# Patient Record
Sex: Female | Born: 1975 | Race: White | Hispanic: No | Marital: Married | Smoking: Never smoker
Health system: Southern US, Community
[De-identification: ages and names within clinical notes are randomized; demographics above are authoritative.]

## PROBLEM LIST (undated history)

## (undated) DIAGNOSIS — T7840XA Allergy, unspecified, initial encounter: Secondary | ICD-10-CM

## (undated) DIAGNOSIS — Z8709 Personal history of other diseases of the respiratory system: Secondary | ICD-10-CM

## (undated) DIAGNOSIS — Z8619 Personal history of other infectious and parasitic diseases: Secondary | ICD-10-CM

## (undated) DIAGNOSIS — K219 Gastro-esophageal reflux disease without esophagitis: Secondary | ICD-10-CM

## (undated) DIAGNOSIS — D649 Anemia, unspecified: Secondary | ICD-10-CM

## (undated) DIAGNOSIS — K222 Esophageal obstruction: Secondary | ICD-10-CM

## (undated) DIAGNOSIS — Z87898 Personal history of other specified conditions: Secondary | ICD-10-CM

## (undated) DIAGNOSIS — I10 Essential (primary) hypertension: Secondary | ICD-10-CM

## (undated) DIAGNOSIS — O09529 Supervision of elderly multigravida, unspecified trimester: Secondary | ICD-10-CM

## (undated) HISTORY — DX: Esophageal obstruction: K22.2

## (undated) HISTORY — DX: Anemia, unspecified: D64.9

## (undated) HISTORY — DX: Personal history of other diseases of the respiratory system: Z87.09

## (undated) HISTORY — DX: Supervision of elderly multigravida, unspecified trimester: O09.529

## (undated) HISTORY — DX: Allergy, unspecified, initial encounter: T78.40XA

## (undated) HISTORY — DX: Gastro-esophageal reflux disease without esophagitis: K21.9

## (undated) HISTORY — DX: Essential (primary) hypertension: I10

## (undated) HISTORY — PX: WISDOM TOOTH EXTRACTION: SHX21

## (undated) HISTORY — DX: Personal history of other infectious and parasitic diseases: Z86.19

## (undated) HISTORY — DX: Personal history of other specified conditions: Z87.898

---

## 2006-05-23 ENCOUNTER — Ambulatory Visit: Payer: Self-pay | Admitting: Family Medicine

## 2006-05-23 ENCOUNTER — Encounter: Payer: Self-pay | Admitting: Family Medicine

## 2007-06-14 ENCOUNTER — Encounter (INDEPENDENT_AMBULATORY_CARE_PROVIDER_SITE_OTHER): Payer: Self-pay | Admitting: *Deleted

## 2007-06-14 ENCOUNTER — Ambulatory Visit (HOSPITAL_COMMUNITY): Admission: RE | Admit: 2007-06-14 | Discharge: 2007-06-14 | Payer: Self-pay | Admitting: Internal Medicine

## 2007-06-21 ENCOUNTER — Encounter: Payer: Self-pay | Admitting: Family Medicine

## 2007-06-21 ENCOUNTER — Ambulatory Visit: Payer: Self-pay | Admitting: Obstetrics & Gynecology

## 2008-01-15 ENCOUNTER — Ambulatory Visit: Payer: Self-pay | Admitting: Obstetrics & Gynecology

## 2008-05-22 ENCOUNTER — Ambulatory Visit: Payer: Self-pay | Admitting: Obstetrics & Gynecology

## 2008-05-23 ENCOUNTER — Encounter: Payer: Self-pay | Admitting: Family Medicine

## 2008-05-23 LAB — CONVERTED CEMR LAB
Antibody Screen: NEGATIVE
Basophils Relative: 1 % (ref 0–1)
Eosinophils Absolute: 0.3 10*3/uL (ref 0.0–0.7)
Hemoglobin: 12.6 g/dL (ref 12.0–15.0)
Lymphs Abs: 1.4 10*3/uL (ref 0.7–4.0)
MCHC: 33.3 g/dL (ref 30.0–36.0)
MCV: 90 fL (ref 78.0–100.0)
Monocytes Absolute: 0.5 10*3/uL (ref 0.1–1.0)
Monocytes Relative: 7 % (ref 3–12)
Neutro Abs: 4.6 10*3/uL (ref 1.7–7.7)
RBC: 4.2 M/uL (ref 3.87–5.11)
Rh Type: NEGATIVE
Rubella: 13.4 intl units/mL — ABNORMAL HIGH
WBC: 6.8 10*3/uL (ref 4.0–10.5)

## 2008-06-09 ENCOUNTER — Ambulatory Visit (HOSPITAL_COMMUNITY): Admission: RE | Admit: 2008-06-09 | Discharge: 2008-06-09 | Payer: Self-pay | Admitting: Family Medicine

## 2008-06-10 ENCOUNTER — Encounter: Payer: Self-pay | Admitting: Obstetrics and Gynecology

## 2008-06-10 ENCOUNTER — Ambulatory Visit: Payer: Self-pay | Admitting: Obstetrics and Gynecology

## 2008-07-02 ENCOUNTER — Ambulatory Visit: Payer: Self-pay | Admitting: Obstetrics & Gynecology

## 2008-07-14 ENCOUNTER — Ambulatory Visit (HOSPITAL_COMMUNITY): Admission: RE | Admit: 2008-07-14 | Discharge: 2008-07-14 | Payer: Self-pay | Admitting: Obstetrics & Gynecology

## 2008-08-05 ENCOUNTER — Ambulatory Visit: Payer: Self-pay | Admitting: Family Medicine

## 2008-08-08 ENCOUNTER — Ambulatory Visit (HOSPITAL_COMMUNITY): Admission: RE | Admit: 2008-08-08 | Discharge: 2008-08-08 | Payer: Self-pay | Admitting: Obstetrics & Gynecology

## 2008-09-01 ENCOUNTER — Ambulatory Visit (HOSPITAL_COMMUNITY): Admission: RE | Admit: 2008-09-01 | Discharge: 2008-09-01 | Payer: Self-pay | Admitting: Obstetrics & Gynecology

## 2008-09-02 ENCOUNTER — Ambulatory Visit: Payer: Self-pay | Admitting: Obstetrics & Gynecology

## 2008-09-30 ENCOUNTER — Ambulatory Visit: Payer: Self-pay | Admitting: Obstetrics & Gynecology

## 2008-10-23 ENCOUNTER — Ambulatory Visit: Payer: Self-pay | Admitting: Obstetrics & Gynecology

## 2008-10-23 LAB — CONVERTED CEMR LAB
Antibody Screen: NEGATIVE
Hemoglobin: 11.6 g/dL — ABNORMAL LOW (ref 12.0–15.0)
MCHC: 33.7 g/dL (ref 30.0–36.0)
RDW: 14.3 % (ref 11.5–15.5)

## 2008-10-30 ENCOUNTER — Ambulatory Visit: Payer: Self-pay | Admitting: Obstetrics & Gynecology

## 2008-11-06 ENCOUNTER — Ambulatory Visit: Payer: Self-pay | Admitting: Obstetrics & Gynecology

## 2008-11-20 ENCOUNTER — Ambulatory Visit: Payer: Self-pay | Admitting: Obstetrics and Gynecology

## 2008-12-08 ENCOUNTER — Ambulatory Visit: Payer: Self-pay | Admitting: Obstetrics & Gynecology

## 2008-12-23 ENCOUNTER — Ambulatory Visit: Payer: Self-pay | Admitting: Obstetrics & Gynecology

## 2008-12-30 ENCOUNTER — Encounter: Payer: Self-pay | Admitting: Obstetrics & Gynecology

## 2008-12-30 ENCOUNTER — Ambulatory Visit: Payer: Self-pay | Admitting: Family Medicine

## 2008-12-31 ENCOUNTER — Encounter: Payer: Self-pay | Admitting: Obstetrics & Gynecology

## 2009-01-06 ENCOUNTER — Ambulatory Visit: Payer: Self-pay | Admitting: Obstetrics & Gynecology

## 2009-01-13 ENCOUNTER — Ambulatory Visit: Payer: Self-pay | Admitting: Family Medicine

## 2009-01-20 ENCOUNTER — Ambulatory Visit: Payer: Self-pay | Admitting: Obstetrics & Gynecology

## 2009-01-26 ENCOUNTER — Ambulatory Visit: Payer: Self-pay | Admitting: Obstetrics and Gynecology

## 2009-01-29 ENCOUNTER — Ambulatory Visit (HOSPITAL_COMMUNITY): Admission: RE | Admit: 2009-01-29 | Discharge: 2009-01-29 | Payer: Self-pay | Admitting: Obstetrics & Gynecology

## 2009-01-30 ENCOUNTER — Ambulatory Visit: Payer: Self-pay | Admitting: Obstetrics & Gynecology

## 2009-01-30 ENCOUNTER — Inpatient Hospital Stay (HOSPITAL_COMMUNITY): Admission: RE | Admit: 2009-01-30 | Discharge: 2009-02-02 | Payer: Self-pay | Admitting: Obstetrics & Gynecology

## 2009-03-02 ENCOUNTER — Ambulatory Visit: Payer: Self-pay | Admitting: Obstetrics and Gynecology

## 2009-03-02 LAB — CONVERTED CEMR LAB
HCT: 30.6 % — ABNORMAL LOW (ref 36.0–46.0)
MCHC: 32.4 g/dL (ref 30.0–36.0)
MCV: 89.5 fL (ref 78.0–100.0)
Platelets: 205 10*3/uL (ref 150–400)
RDW: 14.2 % (ref 11.5–15.5)
TSH: 2.844 microintl units/mL (ref 0.350–4.500)
WBC: 5.1 10*3/uL (ref 4.0–10.5)

## 2009-03-16 ENCOUNTER — Ambulatory Visit: Payer: Self-pay | Admitting: Family Medicine

## 2009-04-08 ENCOUNTER — Ambulatory Visit: Payer: Self-pay | Admitting: Obstetrics and Gynecology

## 2009-04-08 LAB — CONVERTED CEMR LAB
HCT: 37.4 % (ref 36.0–46.0)
MCHC: 32.6 g/dL (ref 30.0–36.0)
MCV: 86.4 fL (ref 78.0–100.0)
Platelets: 217 10*3/uL (ref 150–400)
RDW: 14.1 % (ref 11.5–15.5)
WBC: 5.4 10*3/uL (ref 4.0–10.5)

## 2009-04-10 ENCOUNTER — Ambulatory Visit: Payer: Self-pay | Admitting: Family Medicine

## 2009-06-10 ENCOUNTER — Ambulatory Visit: Payer: Self-pay | Admitting: Family Medicine

## 2009-12-10 ENCOUNTER — Encounter (INDEPENDENT_AMBULATORY_CARE_PROVIDER_SITE_OTHER): Payer: Self-pay | Admitting: *Deleted

## 2009-12-21 DIAGNOSIS — K224 Dyskinesia of esophagus: Secondary | ICD-10-CM

## 2009-12-21 DIAGNOSIS — D649 Anemia, unspecified: Secondary | ICD-10-CM | POA: Insufficient documentation

## 2009-12-22 ENCOUNTER — Ambulatory Visit: Payer: Self-pay | Admitting: Internal Medicine

## 2009-12-22 DIAGNOSIS — J019 Acute sinusitis, unspecified: Secondary | ICD-10-CM

## 2010-01-07 ENCOUNTER — Ambulatory Visit: Payer: Self-pay | Admitting: Internal Medicine

## 2010-04-06 ENCOUNTER — Encounter: Payer: Self-pay | Admitting: Internal Medicine

## 2010-06-01 NOTE — Assessment & Plan Note (Signed)
Summary: esophageal spasms...Marland KitchenMarland KitchenMarland Kitchenem   History of Present Illness Visit Type: consult  Primary GI MD: Lina Sar MD Primary Provider: Shelbie Proctor. Shawnie Pons, MD Requesting Provider: Shelbie Proctor. Shawnie Pons, MD Chief Complaint: Dysphagia, belching, and food gets stuck when she swallows  History of Present Illness:   This is a 35 year old white female with intermittent choking with  liquids and solids.She points to her throat when she describes her Sx's.  This has been occurring for the past 3 years. Episodes occur 2-3 times a week and lasts for several seconds or up to 2 hours. The episodes could be associated with drinking cold liquids but also with regular food. She usually has to run to the bathroom and tries to throw up the food. She denies any hoarseness or coughing. The episodes wake her at night. She denies any heartburn except in the third trimester of her pregnancy when she took Prilosec for heartburn. A barium esophagram in December 2009 showed normal distention of the esophagus and normal motility. A 13 mm tablet passed freely without delay. There was no esophagitis or stricture. She describes the level of the obstruction to be in her throat. There is no pre-existing GI history.   GI Review of Systems    Reports belching and  dysphagia with solids.      Denies abdominal pain, acid reflux, bloating, chest pain, dysphagia with liquids, heartburn, loss of appetite, nausea, vomiting, vomiting blood, weight loss, and  weight gain.        Denies anal fissure, black tarry stools, change in bowel habit, constipation, diarrhea, diverticulosis, fecal incontinence, heme positive stool, hemorrhoids, irritable bowel syndrome, jaundice, light color stool, liver problems, rectal bleeding, and  rectal pain.    Current Medications (verified): 1)  Zyrtec Allergy 10 Mg Caps (Cetirizine Hcl) .... One By Mouth Once Daily As Needed 2)  Prenavite Multiple Vitamin 28-0.8 Mg Tabs (Prenatal Vit-Fe Fumarate-Fa) .... One Tablet  By Mouth Once Daily 3)  Tums 500 Mg Chew (Calcium Carbonate Antacid) .... As Needed  Allergies (verified): 1)  ! 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 Wisdom Teeth Removal  Family History: No FH of Colon Cancer: Family History of Heart Disease:   Social History: Occupation: Engineer, site Married One child  Alcohol Use - yes-socially Daily Caffeine Use Illicit Drug Use - no  Review of Systems       The patient complains of allergy/sinus.  The patient denies anemia, anxiety-new, arthritis/joint pain, back pain, blood in urine, breast changes/lumps, change in vision, confusion, cough, coughing up blood, depression-new, fainting, fatigue, fever, headaches-new, hearing problems, heart murmur, heart rhythm changes, itching, menstrual pain, muscle pains/cramps, night sweats, nosebleeds, pregnancy symptoms, shortness of breath, skin rash, sleeping problems, sore throat, swelling of feet/legs, swollen lymph glands, thirst - excessive , urination - excessive , urination changes/pain, urine leakage, vision changes, and voice change.         Pertinent positive and negative review of systems were noted in the above HPI. All other ROS was otherwise negative.   Vital Signs:  Patient profile:   35 year old female Height:      66 inches Weight:      150 pounds BMI:     24.30 BSA:     1.77 Pulse rate:   68 / minute Pulse rhythm:   regular BP sitting:  122 / 80  (left arm) Cuff size:   regular  Vitals Entered By: Ok Anis CMA (December 22, 2009 9:31 AM)  Physical Exam  General:  healthy-appearing, alert and oriented with normal voice. No coughing or hoarseness. Eyes:  PERRLA, no icterus. Mouth:  normal oral mucosa, normal tonsils and tongue, no aphthous ulcers. Neck:  normal  thyroid. No lymphadenopathy. No bruit. Lungs:  Clear throughout to auscultation. Heart:  Regular rate and rhythm; no murmurs, rubs,  or bruits. Abdomen:  Soft, nontender and nondistended. No masses, hepatosplenomegaly or hernias noted. Normal bowel sounds. Extremities:  No clubbing, cyanosis, edema or deformities noted. Skin:  Intact without significant lesions or rashes. Psych:  Alert and cooperative. Normal mood and affect.   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 A recent barium esophagram was normal without suggestion of a stricture or motility disorder. We will proceed with an upper endoscopy to rule out esophageal web or minimal stricture. If negative, we will proceed with an esophageal manometry to assess the function of the upper esophageal sphincter.R/o myasthenia gravis.  Orders: EGD (EGD)  Patient Instructions: 1)  Upper endoscopy with conscious sedation. 2)  If the upper endoscopy and dilatation is nondiagnostic, we will proceed with an esophageal manometry. I have discussed it with the patient. 3)  Copy sent to : Dr Tinnie Gens 4)  The medication list was reviewed and reconciled.  All changed / newly prescribed medications were explained.  A complete medication list was provided to the patient / caregiver.

## 2010-06-01 NOTE — Letter (Signed)
Summary: New Patient letter  [facility]  [address] [place], [place] [address]   Phone: [phone]  Fax: [phone]       12/10/2009 MRN: [id number]  Mikayla Snow [address] [place], [place]  [address]  Dear Ms. Rodger,  Welcome to the Gastroenterology Division at [facility].    You are scheduled to see Dr.  Lina Sar on December 22, 2009 at 9:15am on the 3rd floor at [facility], [address]  We ask that you try to arrive at our office 15 minutes prior to your appointment time to allow for check-in.  We would like you to complete the enclosed self-administered evaluation form prior to your visit and bring it with you on the day of your appointment.  We will review it with you.  Also, please bring a complete list of all your medications or, if you prefer, bring the medication bottles and we will list them.  Please bring your insurance card so that we may make a copy of it.  If your insurance requires a referral to see a specialist, please bring your referral form from your primary care physician.  Co-payments are due at the time of your visit and may be paid by cash, check or credit card.     Your office visit will consist of a consult with your physician (includes a physical exam), any laboratory testing he/she may order, scheduling of any necessary diagnostic testing (e.g. x-ray, ultrasound, CT-scan), and scheduling of a procedure (e.g. Endoscopy, Colonoscopy) if required.  Please allow enough time on your schedule to allow for any/all of these possibilities.    If you cannot keep your appointment, please call [phone] to cancel or reschedule prior to your appointment date.  This allows [place] the opportunity to schedule an appointment for another patient in need of care.  If you do not cancel or reschedule by 5 p.m. the business day prior to your appointment date, you will be charged a $50.00 late cancellation/no-show fee.    Thank you for  choosing [facility] for your medical needs.  We appreciate the opportunity to care for you.  Please visit [place] at our website  to learn more about our practice.                     Sincerely,                                                             The Gastroenterology Division

## 2010-06-01 NOTE — Medication Information (Signed)
Summary: Approved Omeprazole / [facility]  Approved Omeprazole / [facility]   Imported By: Lennie Odor 04/09/2010 15:34:13  _____________________________________________________________________  External Attachment:    Type:   Image     Comment:   External Document

## 2010-06-01 NOTE — Miscellaneous (Signed)
Summary: prilosec order  Clinical Lists Changes  Medications: Added new medication of PRILOSEC 20 MG  CPDR (OMEPRAZOLE) 1 each day 30 minutes before meal - Signed Rx of PRILOSEC 20 MG  CPDR (OMEPRAZOLE) 1 each day 30 minutes before meal;  #30 x 6;  Signed;  Entered by: Alease Frame RN;  Authorized by: Hart Carwin MD;  Method used: Electronically to [facility]  [place] [place], [place]  [address], Ph: [phone] or [phone], Fax: [phone] Observations: Added new observation of ALLERGY REV: Done (01/07/2010 17:25)    Prescriptions: PRILOSEC 20 MG  CPDR (OMEPRAZOLE) 1 each day 30 minutes before meal  #30 x 6   Entered by:   Alease Frame RN   Authorized by:   Hart Carwin MD   Signed by:   Alease Frame RN on 01/07/2010   Method used:   [facility]ronically to        [facility]  [place] (retail)       [address]       [place], [place]  [address]       Ph: [phone] or [phone]       Fax: [phone]   RxID:   [id number]

## 2010-06-01 NOTE — Letter (Signed)
Summary: EGD Instructions  [facility]  [address] [place], [place] [address]   Phone: [phone]  Fax: [phone]       Mikayla Snow    Apr 01, 1976    MRN: [id number]       Procedure Day /Date: Thursday 01/07/10     Arrival Time: 3:00 pm     Procedure Time: 4:00 pm     Location of Procedure:                    _x _  Endoscopy Center (4th Floor)  PREPARATION FOR ENDOSCOPY   On 01/07/10 THE DAY OF THE PROCEDURE:  1.   No solid foods, milk or milk products are allowed after midnight the night before your procedure.  2.   Do not drink anything colored red or purple.  Avoid juices with pulp.  No orange juice.  3.  You may drink clear liquids until 2:00 pm, which is 2 hours before your procedure.                                                                                                CLEAR LIQUIDS INCLUDE: Water Jello Ice Popsicles Tea (sugar ok, no milk/cream) Powdered fruit flavored drinks Coffee (sugar ok, no milk/cream) Gatorade Juice: apple, white grape, white cranberry  Lemonade Clear bullion, consomm, broth Carbonated beverages (any kind) Strained chicken noodle soup Hard Candy   MEDICATION INSTRUCTIONS  Unless otherwise instructed, you should take regular prescription medications with a small sip of water as early as possible the morning of your procedure.                  OTHER INSTRUCTIONS  You will need a responsible adult at least 37 years of age to accompany you and drive you home.   This person must remain in the waiting room during your procedure.  Wear loose fitting clothing that is easily removed.  Leave jewelry and other valuables at home.  However, you may wish to bring a book to read or an iPod/MP3 player to listen to music as you wait for your procedure to start.  Remove all body piercing jewelry and leave at home.  Total time from sign-in until discharge is approximately 2-3 hours.  You should go home  directly after your procedure and rest.  You can resume normal activities the day after your procedure.  The day of your procedure you should not:   Drive   Make legal decisions   Operate machinery   Drink alcohol   Return to work  You will receive specific instructions about eating, activities and medications before you leave.    The above instructions have been reviewed and explained to me by  Lamona Curl CMA Duncan Dull)  December 22, 2009 10:51 AM     I fully understand and can verbalize these instructions _____________________________ Date 12/22/09

## 2010-06-01 NOTE — Procedures (Signed)
Summary: Upper Endoscopy  Patient: Mikayla Snow Note: All result statuses are Final unless otherwise noted.  Tests: (1) Upper Endoscopy (EGD)   EGD Upper Endoscopy       DONE     [facility]     [address]     [place], [place]  [address]           ENDOSCOPY PROCEDURE REPORT           PATIENT:  [name], [name]  MR#:  [id number]     BIRTHDATE:  31-Jul-1975, 34 yrs. old  GENDER:  female           ENDOSCOPIST:  Hedwig Morton. Juanda Chance, MD     Referred by:  Jonette Eva, M.D.           PROCEDURE DATE:  01/07/2010     PROCEDURE:  EGD with dilatation over guidewire     ASA CLASS:  Class I     INDICATIONS:  dysphagia barium swallow 2009 was normal           MEDICATIONS:   Versed 8 mg, Fentanyl 75 mcg     TOPICAL ANESTHETIC:  Exactacain Spray           DESCRIPTION OF PROCEDURE:   After the risks benefits and     alternatives of the procedure were thoroughly explained, informed     consent was obtained.  The LB GIF-H180 [id number] endoscope was     introduced through the mouth and advanced to the second portion of     the duodenum, without limitations.  The instrument was slowly     withdrawn as the mucosa was fully examined.     <<PROCEDUREIMAGES>>           A stricture was found in the distal esophagus (see image1, image6,     and image7). benign appearing distal esophageal stricture at 36     cm, admitted the scope with mild resistance, diameter abou 14 mm     Savary dilation over a guidewire 14,15,16,17 mm dilator passed     without difficulty  A hiatal hernia was found (see image5 and     image4). 3 cm hiatal hernia not reducible  Otherwise the     examination was normal (see image2, image3, and image4).     Retroflexed views revealed no abnormalities.    The scope was then     withdrawn from the patient and the procedure completed.           COMPLICATIONS:  None           ENDOSCOPIC IMPRESSION:     1) Stricture in the distal esophagus     2) Hiatal hernia     3) Otherwise normal  examination     s/p dilation to 17 mm ( 59F)     RECOMMENDATIONS:     1) Anti-reflux regimen to be follow     Prelosec 20 mg po qd, #30, 6 refills           REPEAT EXAM:  In 0 year(s) for.  redilate prn           ______________________________     Hedwig Morton. Juanda Chance, MD           CC:           n.     eSIGNED:   Hedwig Morton. Brodie at 01/07/2010 05:09 PM           [place], [phone]  Note: An exclamation mark (!) indicates a result that was not dispersed into the flowsheet. Document Creation Date: 01/07/2010 5:10 PM _______________________________________________________________________  (1) Order result status: Final Collection or observation date-time: 01/07/2010 16:56 Requested date-time:  Receipt date-time:  Reported date-time:  Referring Physician:   Ordering Physician: Lina Sar [phone]) Specimen Source:  Source: Launa Grill Order Number: [id number] Lab site:

## 2010-06-15 ENCOUNTER — Ambulatory Visit: Payer: Self-pay | Admitting: Obstetrics and Gynecology

## 2010-06-29 ENCOUNTER — Ambulatory Visit: Payer: BC Managed Care – PPO | Admitting: Obstetrics and Gynecology

## 2010-06-29 DIAGNOSIS — Z1272 Encounter for screening for malignant neoplasm of vagina: Secondary | ICD-10-CM

## 2010-06-29 DIAGNOSIS — Z01419 Encounter for gynecological examination (general) (routine) without abnormal findings: Secondary | ICD-10-CM

## 2010-07-23 NOTE — Assessment & Plan Note (Signed)
NAME:  Mikayla, Snow                 ACCOUNT NO.:  [id number]  MEDICAL RECORD NO.:  [id number]          PATIENT TYPE:  LOCATION:  [facility] at [facility]           FACILITY:  PHYSICIAN:  Allie Bossier, MD             DATE OF BIRTH:  DATE OF SERVICE:  06/29/2010                                 CLINIC NOTE  IDENTIFICATION:  [name] is a 35 year old married white G1, P1.  She has a son who was born in October at 10 pounds and 6 ounces via forceps vaginal delivery with a third-degree care.  She comes in here today for annual exam.  She denies any complaints.  She and her husband have been actively trying to have another pregnancy since July 2011, when she resumed her having periods (she breast fed for 9 months).  She is taking folic acid and a vitamin.  She is using ovulation prediction kits and is ovulating.  PAST MEDICAL HISTORY: 1. Esophageal stricture in the past. 2. Obviously, she has had a large-for-gestational-age infant with a     normal 1-hour Glucola.  PAST SURGICAL HISTORY:  Wisdom teeth extraction.  DRUG ALLERGIES:  IBUPROFEN causes hives.  No latex allergies.  MEDICATIONS:  She takes Prilosec daily and prenatal vitamins daily, one with Zyrtec 10 mg daily.  PHYSICAL EXAMINATION:  GENERAL:  Well-nourished, well-hydrated, very pleasant white female. VITAL SIGNS:  Weight 154 pounds, blood pressure 127/72, pulse 81, her height is 5 feet 5.5 inches. HEENT:  Normal. BREASTS:  Normal. HEART:  Regular rate and rhythm. LUNGS:  Clear to auscultation bilaterally. ABDOMEN:  Benign. EXTERNAL GENITALIA:  No lesions.  She does have a moderate amount of cystocele and rectocele and uterine prolapse.  On speculum exam, her cervix is patulous, but no lesions are noted.  Bimanual exam reveals a normal size and shape retroverted uterus, nontender and mobile.  Her adnexa are nontender without masses.  ASSESSMENT AND PLAN: 1. Annual examination.  I have checked Pap smear, recommended  self-     breast and self-vulvar examinations. 2. Desire for conception.  I have explained that the average couple     takes a year to conceive and that should she not conceive by July     2012, she should come in and I will offer her Clomid therapy.    Allie Bossier, MD   MCD/MEDQ  D:  06/29/2010  T:  06/30/2010  Job:  [id number]

## 2010-08-05 LAB — CBC
HCT: 36.5 % (ref 36.0–46.0)
Hemoglobin: 7.7 g/dL — CL (ref 12.0–15.0)
Hemoglobin: 8.7 g/dL — ABNORMAL LOW (ref 12.0–15.0)
MCHC: 34.6 g/dL (ref 30.0–36.0)
MCHC: 34.6 g/dL (ref 30.0–36.0)
Platelets: 133 10*3/uL — ABNORMAL LOW (ref 150–400)
Platelets: 163 10*3/uL (ref 150–400)
RBC: 2.69 MIL/uL — ABNORMAL LOW (ref 3.87–5.11)
RDW: 13.8 % (ref 11.5–15.5)
RDW: 14.1 % (ref 11.5–15.5)
WBC: 10.3 10*3/uL (ref 4.0–10.5)
WBC: 25.9 10*3/uL — ABNORMAL HIGH (ref 4.0–10.5)

## 2010-08-05 LAB — RH IMMUNE GLOB WKUP(>/=20WKS)(NOT WOMEN'S HOSP): Fetal Screen: NEGATIVE

## 2010-08-05 LAB — RPR: RPR Ser Ql: NONREACTIVE

## 2010-09-11 ENCOUNTER — Other Ambulatory Visit: Payer: Self-pay | Admitting: Internal Medicine

## 2010-09-14 NOTE — Assessment & Plan Note (Signed)
NAMEHAVANNAH, STREAT                  ACCOUNT NO.:  [id number]   MEDICAL RECORD NO.:  [id number]          PATIENT TYPE:  POB   LOCATION:  [facility] at [facility]         FACILITY:  [facility]   PHYSICIAN:  Elsie Lincoln, MD      DATE OF BIRTH:  28-Dec-1975   DATE OF SERVICE:  01/15/2008                                  CLINIC NOTE   The patient is a 35 year old nulliparous female who has gone off birth  control pills.  Her last menstrual period was December 25, 2007.  She is  trying to conceive.  She has been taking prenatal vitamins before and  stopping the birth control pills.  We briefly reviewed her past medical  history and there is no past medical problems.  Her family history is  not complicated by any genetic abnormalities or conditions that should  be investigated prior to conception.  She has agreed to a cystic  fibrosis screen today.  The father of the baby/husband's family history  is also clear.  Blood pressure today was 118/80, weight 141, height 5  feet 5-1/2 inches.  She is of normal BMI.  The patient was warned of the  increased risk of twins during the first month of last cycle.  I had to  stop her birth control pills and she is fine with this.  She knows to  call us after she misses her first period and has a positive pregnancy  test.  If she goes more than 6 months without conceiving, she should  make a followup appointment for infertility.           ______________________________  Elsie Lincoln, MD     KL/MEDQ  D:  01/15/2008  T:  01/16/2008  Job:  [id number]

## 2010-09-14 NOTE — Assessment & Plan Note (Signed)
NAMEGLENA, Mikayla Snow                  ACCOUNT NO.:  [id number]   MEDICAL RECORD NO.:  [id number]          PATIENT TYPE:  POB   LOCATION:  [facility] at [facility]         FACILITY:  [facility]   PHYSICIAN:  Argentina Donovan, MD        DATE OF BIRTH:  1975-10-23   DATE OF SERVICE:  03/16/2009                                  CLINIC NOTE   The patient is a 35 year old gravida 1, para 1-0-0-1 who is in for her 6-  week visit.  She came in 1 week before because of symptoms that was  thought by the screener that might be postpartum depression.  I do not  think so and she was doing very well at that time.  She is nursing 8-9  times a day and on 3 days before this visit, she began having heavier  bleeding, called the emergency number and they think probably a period  starting and that is very very possible as it starting to slack-off  today.  She had been spotting continually since then, she has also not  had sexual intercourse or any sexual contacts since the birth of the  baby.  She has had some lower abdominal and pelvic pressure when she  stands on her feet.   PHYSICAL EXAMINATION:  ABDOMEN:  Soft, nontender.  No masses or  organomegaly.  GU:  External genitalia is normal.  BUS within normal limits.  The  vagina is clean with a moderate amount of blood.  The patient is having  a period.  Cervix is clean and well healed.  The uterus is well  involuted.  The rectum is full with stool, however; and the adnexa is  normal.   IMPRESSION:  Normal postpartum exam.  I am going to start her on Low-  Ogestrel, which she was taken with her prior to getting pregnant.  She  said she did very well on those.  I have told I did not see any reason  why she could not restart sexual activity or see any sign of any  permanent or injury that she should probably do find in her postpartum  state.  The CBC and thyroid that were done prior were normal with  exception of a marked anemia, which she apparently run with a 9.9  hemoglobin and 30.6 hematocrit.  She is on iron supplement.  I am going  to have him repeat CBC in 1 month 6 weeks and then she can stop her  heavy intake of iron.  She is still taking prenatal vitamins, encouraged  her to continue on.  We are starting on birth control pills as of now.           ______________________________  Argentina Donovan, MD     PR/MEDQ  D:  03/16/2009  T:  03/17/2009  Job:  [id number]

## 2010-09-14 NOTE — Assessment & Plan Note (Signed)
[name], [name]                  ACCOUNT NO.:  [id number]   MEDICAL RECORD NO.:  [id number]          PATIENT TYPE:  POB   LOCATION:  [facility]         FACILITY:  [facility]   PHYSICIAN:  Argentina Donovan, MD        DATE OF BIRTH:  04/16/1976   DATE OF SERVICE:  03/02/2009                                  CLINIC NOTE   The patient is a 35 year old gravida 1, para 1-0-0-1, who delivered a 10  pounds 6 ounces female infant by outlet forceps delivery with a third-  degree extension of the midline episiotomy on January 31, 2009.  She has  done very well postoperatively from a physical point of view as far as  healing of the episiotomy and pelvic pain, etc.  She has been seeing a  lactation specialist because she was having a little problem with nipple  soreness and latching on and that seems to be under control.  But what  she came in for today was mainly a complaint of extreme difficulty with  temperature management.  She has terrible sweats and then that will pass  and then she cannot tolerate the cold, so she follows like sweats  following by chills.  She said she had a spot on her tonsil, but she has  not really had any sore throat.  She has not had any respiratory  symptoms at all.  She denies snoring.  She has no significant pain.  She  has some usual postpartum blues symptoms like occasionally crying for  apparently no reason, but she does not seem to have symptoms of any type  of true postpartum depression per se that could develop certainly and  her mother is on medication for depression and told her that she  probably ought to come in.  With exception of this temperature control,  she seems to be pretty much asymptomatic and we are going to get a CBC  and a thyroid screen, and I am going to have her come back in a week.  I  have told her if these symptoms seem to go away, nor coming back, she  does not have any other symptoms that would give suspicion to underlying  infection and  she is doing in 2 weeks for 6-week checkup so as a safety  precaution, we will have her come back in a week if symptoms are  persisting.  I told her if she does come back, to bring her husband with  her, so sometimes he notices things that she has not told us about.  In  any case, she certainly does not think she is depressed, does not seem  to have any symptoms for depression, and I am just concerned about some  type of underlying infection may be causing the day sweats other than  that from the abrupt hormonal changes that occur postpartum.   IMPRESSION:  A well-healing postpartum with a chief complaint of  difficulty tolerating heat or cold.           ______________________________  Argentina Donovan, MD     PR/MEDQ  D:  03/02/2009  T:  03/03/2009  Job:  [id number] 

## 2010-09-14 NOTE — Assessment & Plan Note (Signed)
Mikayla Snow, Mikayla Snow                  ACCOUNT NO.:  [id number]   MEDICAL RECORD NO.:  [id number]          PATIENT TYPE:  POB   LOCATION:  [facility] at [facility]         FACILITY:  [facility]   PHYSICIAN:  Tinnie Gens, MD        DATE OF BIRTH:  Jun 22, 1975   DATE OF SERVICE:  06/10/2009                                  CLINIC NOTE   CHIEF COMPLAINT:  Yearly exam.   HISTORY OF PRESENT ILLNESS:  The patient is a 35 year old gravida 1,  para 1 who had a baby in October, 10 pounds 6 ounces with forceps and a  third-degree tear.  She is breastfeeding and doing well.  She pumps at  work.  She is on fenugreek to help with the supply.  She is interested  in trying to try to conceive again in the next few months.  She  continues on prenatal vitamins.  She reports her mood is good.  She has  not had a cycle.   PAST MEDICAL HISTORY:  Negative.   PAST SURGICAL HISTORY:  Wisdom teeth removal.   MEDICATIONS:  Fenugreek  and Zyrtec daily.   ALLERGIES:  IBUPROFEN.   FAMILY HISTORY:  Hypertension in her parents.   SOCIAL HISTORY:  The patient works as a Nurse, mental health.  She is a  social alcohol user and occasional caffeine user.  She does not smoke.   OBSTETRICAL HISTORY:  G1, P1 with 1 forceps-assisted vaginal delivery of  a 10-pound 6-ounce baby after pushing for 4 hours with a third-degree  episiotomy and extension.   GYNECOLOGIC HISTORY:  Menarche at age 89.  Cycles are regular except for  right now while she is nursing.  She has history of OCPs.  No history of  STDs.  No history of abnormal Pap smear.  Last Pap was in 2010 and was  normal.   PHYSICAL EXAMINATION:  VITAL SIGNS:  Today, her vitals are as noted in  the chart.  GENERAL:  She is a well-developed, well-nourished female, in no acute  distress.  BREASTS:  Symmetric, full of milk.  No significant masses noted.  NECK:  Her thyroid is normal in contour, no mass.  LUNGS:  Clear bilaterally.  CARDIOVASCULAR:  Regular rate and rhythm.  No  rubs, gallops, or murmurs.  ABDOMEN:  Soft, nontender, nondistended.  EXTREMITIES:  No cyanosis, clubbing, or edema.  GU:  Normal external female genitalia.  BUS is normal.  Vagina is pink  and rugated.  There is some descensus noted anteriorly and posteriorly  and the vaginal walls come in as well.  Cervix is without lesions.  Uterus is small, anteverted.  No adnexal mass or tenderness.   IMPRESSION:  Yearly exam, breastfeeding female.   PLAN:  Encouraged her to try achieve pregnancy again soon given her age.  Continue prenatal vitamins.           ______________________________  Tinnie Gens, MD     TP/MEDQ  D:  06/10/2009  T:  06/11/2009  Job:  [id number]

## 2010-09-14 NOTE — Assessment & Plan Note (Signed)
NAMEBRIT, CARBONELL                  ACCOUNT NO.:  [id number]   MEDICAL RECORD NO.:  [id number]          PATIENT TYPE:  POB   LOCATION:  [facility] at [facility]         FACILITY:  [facility]   PHYSICIAN:  Elsie Lincoln, MD      DATE OF BIRTH:  12-30-1975   DATE OF SERVICE:  06/21/2007                                  CLINIC NOTE   The patient is a 35 year old G0 female who presents for yearly exam.  She has normal periods and needs some Lo/Ovral generic for her birth  control.  She is not on it this past month because her prescription ran  out and she is using condoms for birth control right now. She will  restart the Lo/Ovral generic with her next period. Prescription was  given today.  The patient is considering getting pregnant.  We talked  about folic acid and cystic fibrosis screening.  Patient has no  complaints.   PAST MEDICAL HISTORY:  No changes.   PAST SURGICAL HISTORY:  No changes.   PAST GYN HISTORY:  No changes in last year other than the above stopping  of the pills.   SOCIAL HISTORY:  She is married for 10 years and is still employed as a  Runner, broadcasting/film/video.  She started running recently and ran her first half marathon  in just over 2 hours.  She also is performing in a show in [place].  The patient has a good balance between life and work.   REVIEW OF SYSTEMS:  Is negative for all points.   FAMILY HISTORY:  No changes. Her parents still have blood pressure and  there is no familial cancers developed.   VITAL SIGNS:  Pulse 80, blood pressure 143/91, weight 141.5, height 5  feet 5.5 inches.  GENERAL:  Well-nourished, well-developed in apparent distress.  HEENT:  Normocephalic, atraumatic.  Good dentition.  The patient was  reminded to floss. Thyroid no masses.  LUNGS:  Clear to auscultation  bilaterally.  HEART:  Regular rate and rhythm.  ABDOMEN:  Soft, nontender and no organomegaly, no hernia.  GENITALIA:  Tanner five.  Vagina pink, normal rugae.  Bladder and rectum  well  supported.  Urethra nontender.  Cervix closed, nontender. Uterus  anteverted, nontender. Adnexa no masses, nontender.  RECTAL:  No hemorrhoids.  EXTREMITIES:  Nontender.   ASSESSMENT/PLAN:  A 35 year old female with well woman exam.  1. Pap smear.  2. The patient would come get CF screening before she conceives  3. Prenatal vitamins 1 month for prior to her trying to conceive.           ______________________________  Elsie Lincoln, MD     KL/MEDQ  D:  06/21/2007  T:  06/21/2007  Job:  [id number]

## 2010-09-17 NOTE — Assessment & Plan Note (Signed)
NAMESUESAN, MOHRMANN                  ACCOUNT NO.:  [id number]   MEDICAL RECORD NO.:  [id number]          PATIENT TYPE:  POB   LOCATION:  [facility] at [facility]         FACILITY:  [facility]   PHYSICIAN:  Matt Holmes, N.P.       DATE OF BIRTH:  1975/12/15   DATE OF SERVICE:  05/23/2006                                  CLINIC NOTE   NEW PATIENT OFFICE NOTE   Ms. Molla is a 35 year old white female gravida 0 who presents for an  annual exam.  She has moved to [place] from [place], [place] and is a Runner, broadcasting/film/video at [employer].  She  voices no complaints and states that she is here for her annual Pap  smear and refill of her birth control pill.  She says that she and her  husband have no plans to start their family within the year.  She states  that her periods are regular, occurring every 28 days, and normal flow  of 4-5 days.  She denies breast, bladder and vaginal symptoms.  She  states she has used patches and rings in the past for birth control but  is happier with birth control pills.   PAST MEDICAL HISTORY:  Noncontributory.   MEDICATIONS:  She is on Low-Ogestrel for birth control and Zyrtec for  seasonal allergies.   SOCIAL HISTORY:  The patient has been married for 9 years.  She is  employed as a Runner, broadcasting/film/video.  She admits to rare alcohol use and drinks 1-2  caffeinated beverages a day.  She denies tobacco use.   REVIEW OF SYSTEMS:  Patient reports vaginal itching a while back that  was treated with 1 day Monistat with resolution of symptoms.  She does  have seasonal allergies and allergies to cat hair.   FAMILY HISTORY:  Both her parents have high blood pressure.   PHYSICAL EXAMINATION:  Patient is a well-nourished, well-developed,  white female.  Her weight is 40.  Her blood pressure is 123/73 and her  pulse is 65.  She states her first day of her last menstrual period was  April 29, 2006.  Head, ears, eyes, nose and throat within normal  limits.   Heart rate and rhythm were regular without murmur, gallop or  cardiac enlargement.  Lungs bilaterally clear to A&P.  Breast exam:  Breasts are bilaterally soft without tenderness, nodes or nipple  discharge bilaterally.  Abdomen soft and scaphoid without  hepatosplenomegaly.  Pelvic exam:  External genitalia within normal  limits for female.  Vagina and clean and rugose with some white creamy  discharge without odor.  Cervix is nontender and nonfriable.  Uterus is  normal shape, size and contour.  Adnexa bilaterally clear and without  tenderness.   IMPRESSION:  1. Normal gyn exam.  2. Contraceptive need.  3. Normal wet prep.   PLAN:  Low-Ogestrel 28s one p.o. q.d. generic substitution is allowed  with refills for 1 year.  She is encouraged to do self breast exams  monthly.  She is to return to the office in 1 year for her annual exam  or p.r.n.  ______________________________  Matt Holmes, N.P.     EMK/MEDQ  D:  05/23/2006  T:  05/23/2006  Job:  [id number]

## 2010-10-14 ENCOUNTER — Other Ambulatory Visit: Payer: Self-pay | Admitting: Obstetrics & Gynecology

## 2010-10-14 ENCOUNTER — Encounter (INDEPENDENT_AMBULATORY_CARE_PROVIDER_SITE_OTHER): Payer: BC Managed Care – PPO

## 2010-10-14 DIAGNOSIS — O3680X0 Pregnancy with inconclusive fetal viability, not applicable or unspecified: Secondary | ICD-10-CM

## 2010-10-14 DIAGNOSIS — Z3480 Encounter for supervision of other normal pregnancy, unspecified trimester: Secondary | ICD-10-CM

## 2010-10-19 ENCOUNTER — Other Ambulatory Visit (HOSPITAL_COMMUNITY): Payer: BC Managed Care – PPO

## 2010-10-19 ENCOUNTER — Ambulatory Visit (HOSPITAL_COMMUNITY)
Admission: RE | Admit: 2010-10-19 | Discharge: 2010-10-19 | Disposition: A | Discharge status: Home / Self Care | Payer: BC Managed Care – PPO | Source: Ambulatory Visit | Attending: Obstetrics & Gynecology | Admitting: Obstetrics & Gynecology

## 2010-10-19 DIAGNOSIS — O09529 Supervision of elderly multigravida, unspecified trimester: Secondary | ICD-10-CM | POA: Insufficient documentation

## 2010-10-19 DIAGNOSIS — O3680X0 Pregnancy with inconclusive fetal viability, not applicable or unspecified: Secondary | ICD-10-CM

## 2010-10-19 DIAGNOSIS — Z3689 Encounter for other specified antenatal screening: Secondary | ICD-10-CM | POA: Insufficient documentation

## 2010-10-27 ENCOUNTER — Encounter (INDEPENDENT_AMBULATORY_CARE_PROVIDER_SITE_OTHER): Payer: BC Managed Care – PPO | Admitting: Obstetrics & Gynecology

## 2010-10-27 ENCOUNTER — Other Ambulatory Visit: Payer: Self-pay | Admitting: Obstetrics & Gynecology

## 2010-10-27 DIAGNOSIS — O09529 Supervision of elderly multigravida, unspecified trimester: Secondary | ICD-10-CM

## 2010-10-27 DIAGNOSIS — Z3480 Encounter for supervision of other normal pregnancy, unspecified trimester: Secondary | ICD-10-CM

## 2010-10-27 DIAGNOSIS — Z3682 Encounter for antenatal screening for nuchal translucency: Secondary | ICD-10-CM

## 2010-11-18 ENCOUNTER — Encounter: Payer: BC Managed Care – PPO | Admitting: Obstetrics & Gynecology

## 2010-11-23 IMAGING — US US OB COMP LESS 14 WK
1 series · 14 of 20 positions shown · non-contrast
Comparison: none

CLINICAL DATA: Viability and dates.

OBSTETRIC <14 WK US AND TRANSVAGINAL OB US
TECHNIQUE: Both transabdominal and transvaginal ultrasound
examinations were performed for complete evaluation of the
gestation as well as the maternal uterus, adnexal regions, and
pelvic cul-de-sac.

[Series 1: us ob transvaginal modify · 20 acquisitions, 14 frames shown]
[im 1/20]
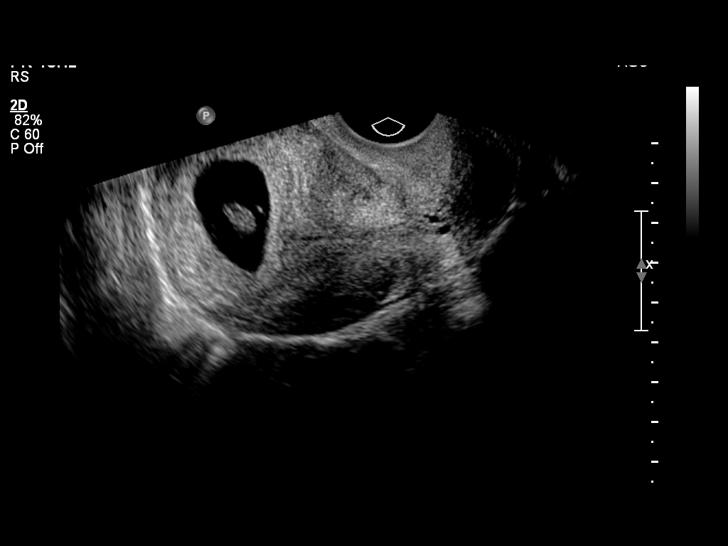
[im 3/20]
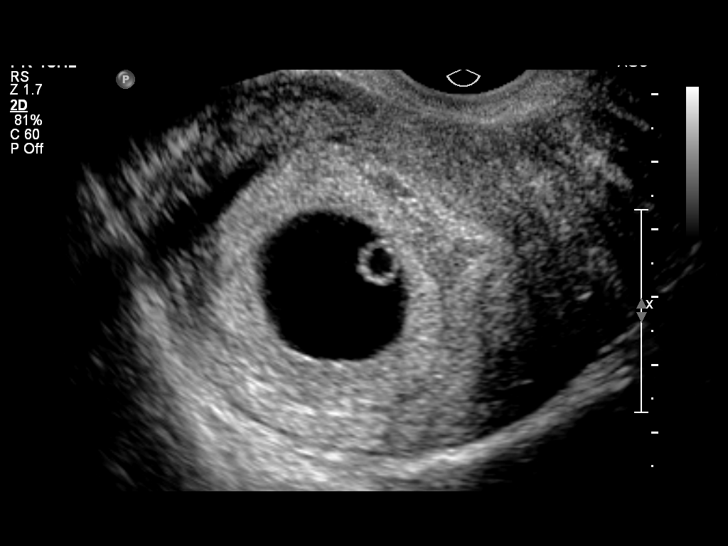
[im 4/20]
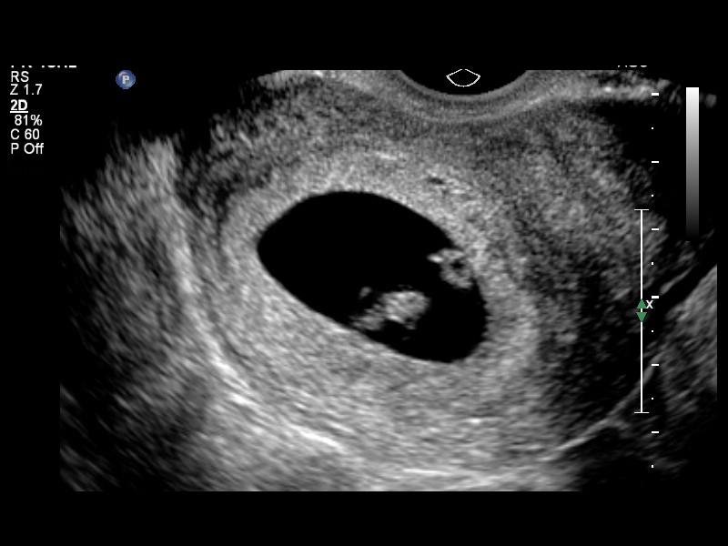
[im 6/20]
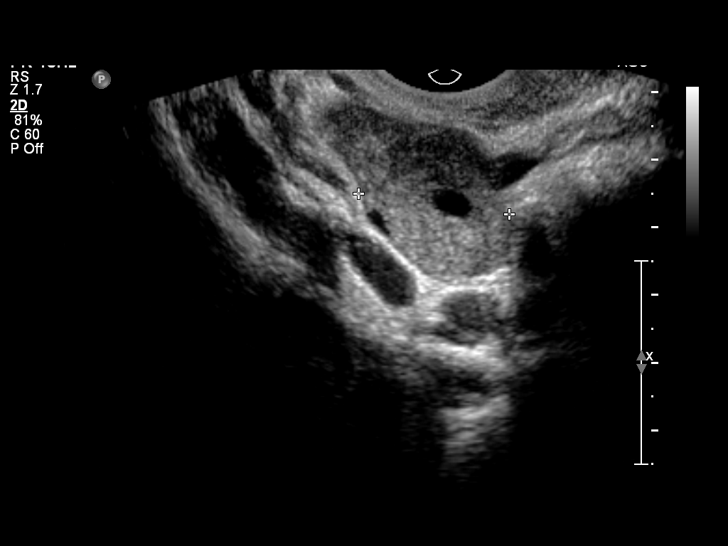
[im 7/20]
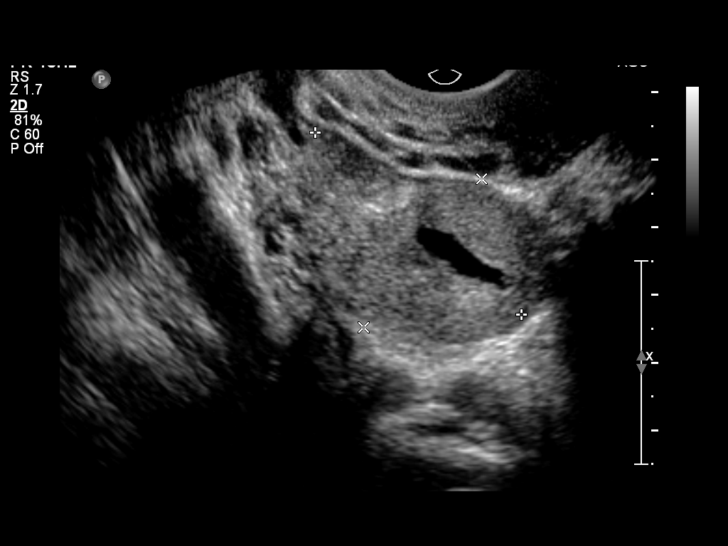
[im 8/20]
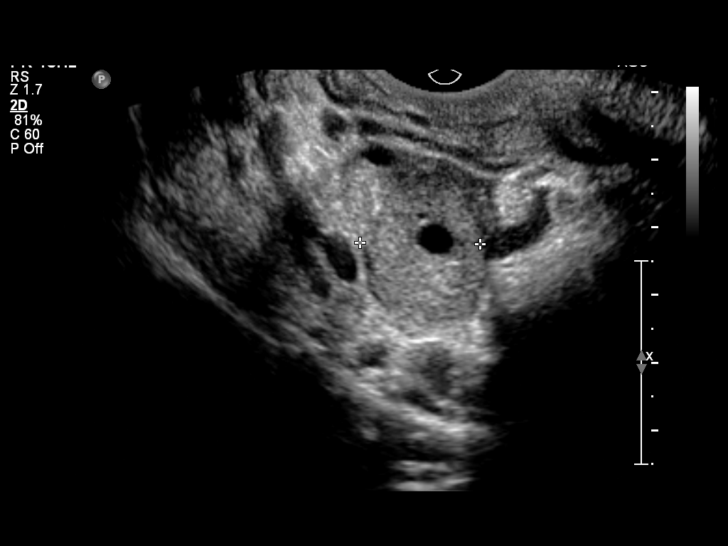
[im 10/20]
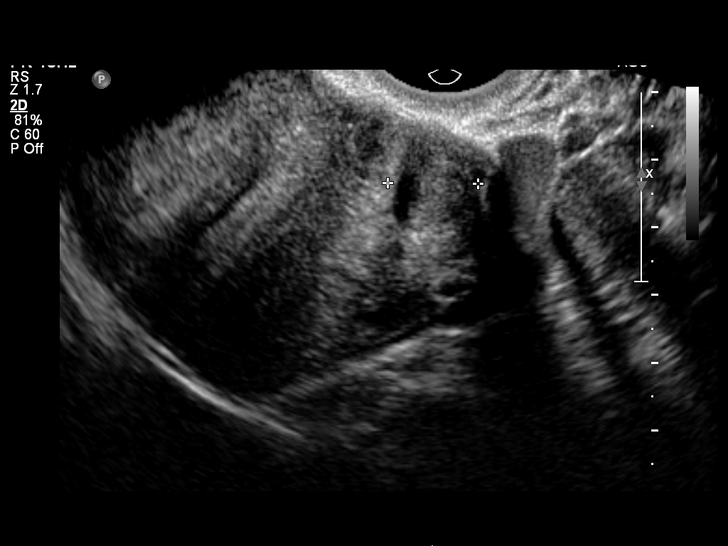
[im 11/20]
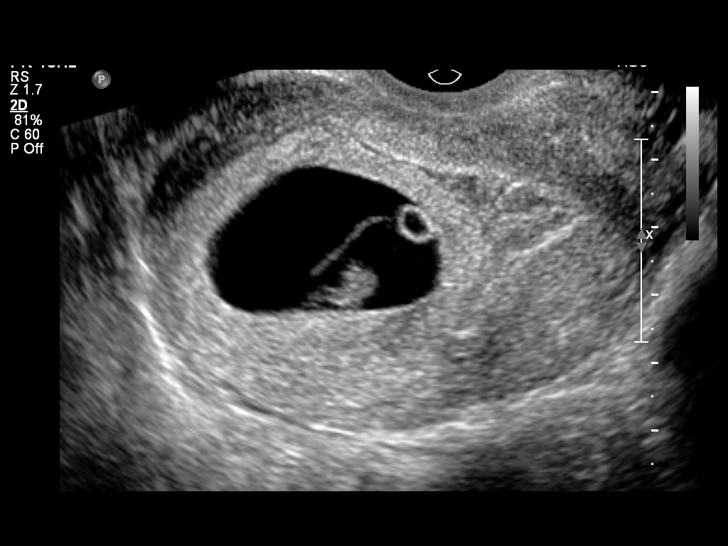
[im 13/20]
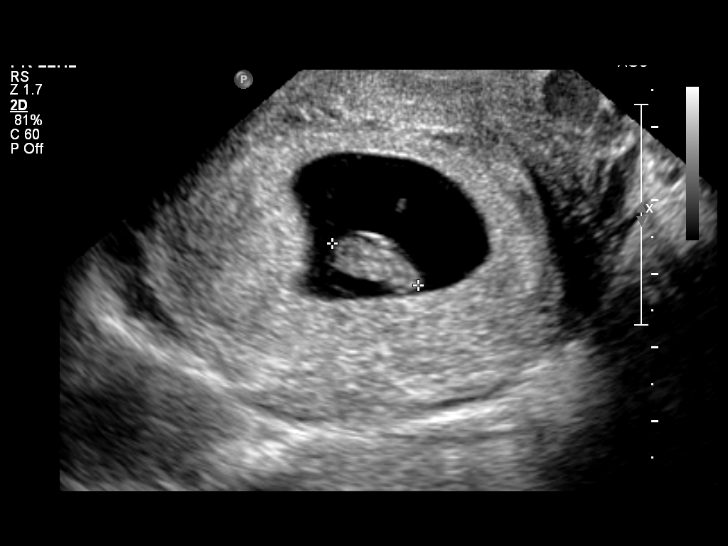
[im 14/20]
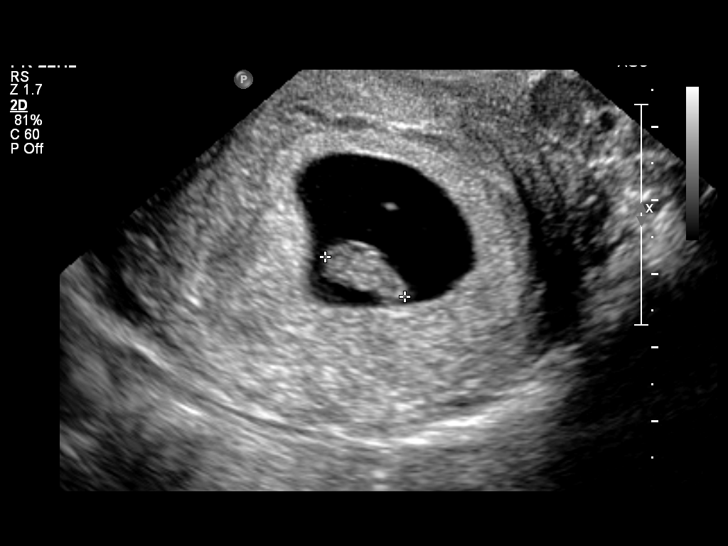
[im 16/20]
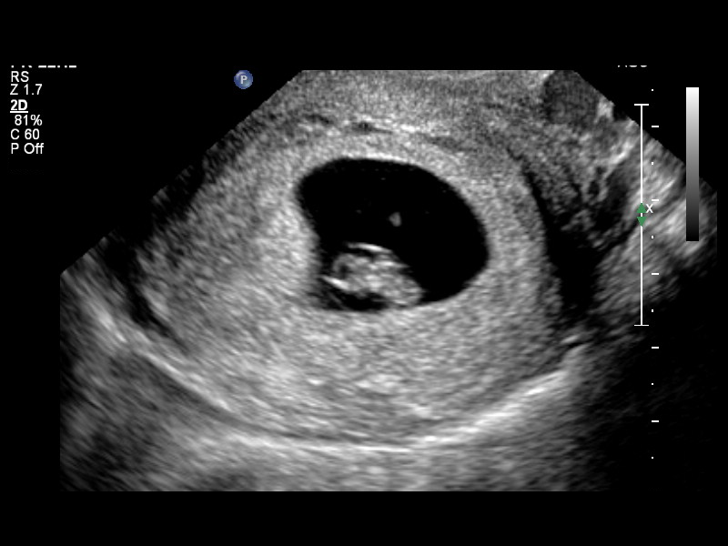
[im 17/20]
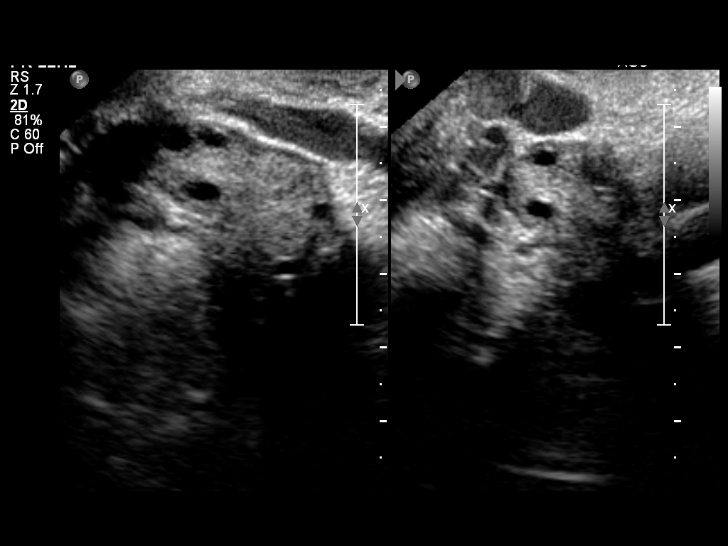
[im 18/20]
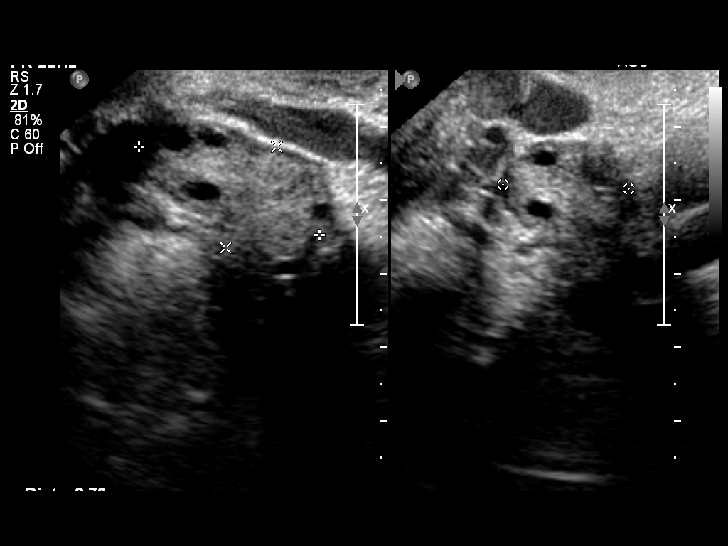
[im 20/20]
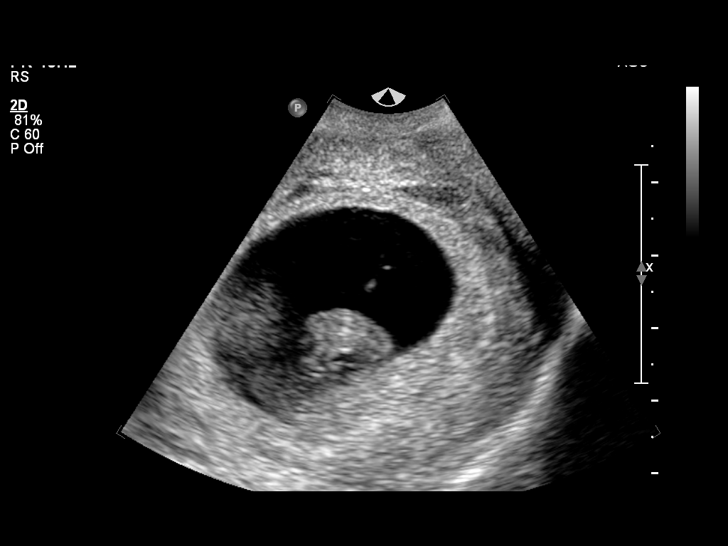

[14 of 20 positions shown; findings below may reference images not displayed]

There is a single intrauterine gestational sac with a small embryo
measuring 8.6 mm which correlates with a 6 weeks 6-day gestation.
A small yolk sac is seen.  The heart rate is 167 beats per minute.
No subchorionic hemorrhage is seen.  The right ovary measures 2.7 x
2.4 x 2.2 cm with a probable collapsing corpus luteum cyst.  The
left ovary was not well seen.  No free pelvic fluid collections are
seen.
IMPRESSION: 1.  Single living intrauterine embryo estimated at 6 weeks and 6
days to station with a heart rate of 167 beats per minute.
2.  No subchorionic hemorrhage.
3.  Normal right ovary.  Left ovary cannot be visualized.

## 2010-11-30 ENCOUNTER — Ambulatory Visit (HOSPITAL_COMMUNITY)
Admission: RE | Admit: 2010-11-30 | Discharge: 2010-11-30 | Disposition: A | Discharge status: Home / Self Care | Payer: BC Managed Care – PPO | Source: Ambulatory Visit | Attending: Obstetrics & Gynecology | Admitting: Obstetrics & Gynecology

## 2010-11-30 ENCOUNTER — Encounter (HOSPITAL_COMMUNITY): Payer: Self-pay

## 2010-11-30 DIAGNOSIS — Z3689 Encounter for other specified antenatal screening: Secondary | ICD-10-CM | POA: Insufficient documentation

## 2010-11-30 DIAGNOSIS — O3510X0 Maternal care for (suspected) chromosomal abnormality in fetus, unspecified, not applicable or unspecified: Secondary | ICD-10-CM | POA: Insufficient documentation

## 2010-11-30 DIAGNOSIS — Z3682 Encounter for antenatal screening for nuchal translucency: Secondary | ICD-10-CM

## 2010-11-30 DIAGNOSIS — O351XX0 Maternal care for (suspected) chromosomal abnormality in fetus, not applicable or unspecified: Secondary | ICD-10-CM | POA: Insufficient documentation

## 2010-11-30 DIAGNOSIS — O09529 Supervision of elderly multigravida, unspecified trimester: Secondary | ICD-10-CM | POA: Insufficient documentation

## 2010-11-30 NOTE — Progress Notes (Signed)
Genetic Counseling  High-Risk Gestation Note  Appointment Date:  11/30/2010 Referred By: Tereso Newcomer, MD Date of Birth:  05/23/1975 Partner:  Mikayla Snow    Pregnancy History: [id number] Estimated Date of Delivery: 06/13/11 Estimated Gestational Age: [redacted]w[redacted]d  I met with Mikayla Snow and her husband, Mikayla Snow for prenatal genetic counseling given advanced maternal age. The couple were accompanied by their son today.   They were counseled regarding maternal age and the association with risk for chromosome conditions due to nondisjunction with aging of the ova.   We reviewed chromosomes, nondisjunction, and the associated 1 in 28 risk for fetal aneuploidy in the first trimester related to a maternal age of 48 at delivery.  They were counseled that the risk for aneuploidy decreases as gestational age increases, accounting for those pregnancies which spontaneously abort.  We specifically discussed Down syndrome (trisomy 30), trisomies 23 and 56, and sex chromosome aneuploidies (47,XXX and 47,XXY) including the common features and prognoses of each.    We reviewed available screening and diagnostic options.  Regarding screening tests, we discussed the options of First screen, Integrated screen, Quad screen and ultrasound).  They understand that screening tests are used to modify a patient's a priori risk for aneuploidy, typically based on age.  This estimate provides a pregnancy specific risk assessment. The screen is not diagnostic, nor does it screen for all chromosome conditions  We also reviewed the availability of diagnostic options including CVS and amniocentesis.  A risk of 1 in 100 was given for CVS and 1 in 200-300 was given for amniocentesis, the primary complication being spontaneous pregnancy loss.  We discussed the risks, limitations, and benefits of each.  It was discussed that not all birth defects can be detected with these procedures. After reviewing these options, Mikayla Snow elected to  proceed with Integrated screening. She will return on 12/21/10 for her second blood draw.  A second trimester detailed ultrasound is available to the patient, and we are happy to perform this at our office, if desired. No follow-up appointment was made at this time. She expressed that she is not interested in pursuing diagnostic testing at this time or in the future, given the associated risk of complications.  They understand that ultrasound and Integrated screen cannot rule out all birth defects or genetic syndromes.  [However, they were counseled that 50-80% of fetuses with Down syndrome and up to 90% of fetuses with trisomies 13 and 18, when well visualized, have detectable anomalies or soft markers by ultrasound.]   Both family histories were reviewed and found to be contributory  for the couple's son born with hypospadias, which was surgically corrected at age 47 months. He is otherwise healthy.  This is not an uncommon difference, occurring in approximately 1 in 1,000 males.  It may occur as an isolated condition or in combination with other medical problems in some genetic syndromes. In the case of isolated hypospadias, recurrence risk for a female sibling is estimated to be 10%. Without further information regarding the provided family history, an accurate genetic risk cannot be calculated.   Further genetic counseling is warranted if more information is obtained.  She denied exposure to environmental toxins or chemical agents.  She denied the use of tobacco or street drugs.  She reported drinking one glass of wine last week.  Prenatal alcohol exposure can increase the risk for growth delays, small head size, heart defects, eye and facial differences, as well as behavior problems and learning disabilities.  The risk of these to occur tends to increase with the amount of alcohol consumed. However, because there is no identified safe amount of alcohol in pregnancy, it is recommended to completely avoid  alcohol in pregnancy. Given the reported amount of exposure, risk for associated effects are likely low in the current pregnancy. She denied significant viral illnesses during the course of her pregnancy.  Her medical and surgical history were noncontributory.  We discussed cystic fibrosis (CF) including: the features of CF, the incidence of 1 in 3300 in the Caucasian population, autosomal recessive inheritance, and the 25% chance of having a baby with CF if both parents are carriers of CF.  We also discussed the option of carrier testing including the pros and cons of carrier testing, as well as the option of prenatal testing if needed. CF is included on the [place] newborn screening panel. Mrs. Kepner reported that CF carrier screening was performed in her last pregnancy and was negative. We do not have medical records to confirm this at this time.   A complete obstetrical ultrasound was performed at the time of today's evaluation.  The ultrasound report is reported separately.    We counseled the patient for approximately 25 minutes regarding the above risks.     Mikayla Snow Hennie Gosa, MS, [facility] 11/30/2010

## 2010-12-15 ENCOUNTER — Ambulatory Visit (INDEPENDENT_AMBULATORY_CARE_PROVIDER_SITE_OTHER): Payer: BC Managed Care – PPO | Admitting: Obstetrics & Gynecology

## 2010-12-15 VITALS — BP 126/65 | Wt 154.0 lb

## 2010-12-15 DIAGNOSIS — O09529 Supervision of elderly multigravida, unspecified trimester: Secondary | ICD-10-CM

## 2010-12-15 DIAGNOSIS — IMO0002 Reserved for concepts with insufficient information to code with codable children: Secondary | ICD-10-CM

## 2010-12-15 DIAGNOSIS — Z3480 Encounter for supervision of other normal pregnancy, unspecified trimester: Secondary | ICD-10-CM

## 2010-12-15 NOTE — Progress Notes (Signed)
Undergoing sequential screening now, will schedule anatomy scan in 4-6 weeks.  PTL precautions reviewed.

## 2010-12-21 ENCOUNTER — Ambulatory Visit (HOSPITAL_COMMUNITY)
Admission: RE | Admit: 2010-12-21 | Discharge: 2010-12-21 | Disposition: A | Discharge status: Home / Self Care | Payer: BC Managed Care – PPO | Source: Ambulatory Visit | Attending: Obstetrics & Gynecology | Admitting: Obstetrics & Gynecology

## 2010-12-21 ENCOUNTER — Other Ambulatory Visit: Payer: Self-pay | Admitting: Maternal and Fetal Medicine

## 2010-12-21 DIAGNOSIS — O351XX0 Maternal care for (suspected) chromosomal abnormality in fetus, not applicable or unspecified: Secondary | ICD-10-CM | POA: Insufficient documentation

## 2010-12-21 DIAGNOSIS — O3510X0 Maternal care for (suspected) chromosomal abnormality in fetus, unspecified, not applicable or unspecified: Secondary | ICD-10-CM | POA: Insufficient documentation

## 2011-01-11 ENCOUNTER — Ambulatory Visit (HOSPITAL_COMMUNITY)
Admission: RE | Admit: 2011-01-11 | Discharge: 2011-01-11 | Disposition: A | Discharge status: Home / Self Care | Payer: BC Managed Care – PPO | Source: Ambulatory Visit | Attending: Obstetrics & Gynecology | Admitting: Obstetrics & Gynecology

## 2011-01-11 DIAGNOSIS — Z363 Encounter for antenatal screening for malformations: Secondary | ICD-10-CM | POA: Insufficient documentation

## 2011-01-11 DIAGNOSIS — O3410 Maternal care for benign tumor of corpus uteri, unspecified trimester: Secondary | ICD-10-CM | POA: Insufficient documentation

## 2011-01-11 DIAGNOSIS — IMO0002 Reserved for concepts with insufficient information to code with codable children: Secondary | ICD-10-CM

## 2011-01-11 DIAGNOSIS — O358XX0 Maternal care for other (suspected) fetal abnormality and damage, not applicable or unspecified: Secondary | ICD-10-CM | POA: Insufficient documentation

## 2011-01-11 DIAGNOSIS — O09529 Supervision of elderly multigravida, unspecified trimester: Secondary | ICD-10-CM | POA: Insufficient documentation

## 2011-01-11 DIAGNOSIS — Z1389 Encounter for screening for other disorder: Secondary | ICD-10-CM | POA: Insufficient documentation

## 2011-01-12 ENCOUNTER — Ambulatory Visit (HOSPITAL_COMMUNITY): Payer: BC Managed Care – PPO

## 2011-01-18 ENCOUNTER — Ambulatory Visit (INDEPENDENT_AMBULATORY_CARE_PROVIDER_SITE_OTHER): Payer: BC Managed Care – PPO | Admitting: Obstetrics & Gynecology

## 2011-01-18 DIAGNOSIS — Z3480 Encounter for supervision of other normal pregnancy, unspecified trimester: Secondary | ICD-10-CM

## 2011-01-18 DIAGNOSIS — B373 Candidiasis of vulva and vagina: Secondary | ICD-10-CM

## 2011-01-18 DIAGNOSIS — O2390 Unspecified genitourinary tract infection in pregnancy, unspecified trimester: Secondary | ICD-10-CM

## 2011-01-18 DIAGNOSIS — N390 Urinary tract infection, site not specified: Secondary | ICD-10-CM

## 2011-01-18 DIAGNOSIS — B3731 Acute candidiasis of vulva and vagina: Secondary | ICD-10-CM

## 2011-01-18 DIAGNOSIS — O09529 Supervision of elderly multigravida, unspecified trimester: Secondary | ICD-10-CM

## 2011-01-18 NOTE — Progress Notes (Signed)
Normal anatomy scan.  No complaints.  Fetal movement and preterm labor precautions reviewed. RTC 4 weeks.

## 2011-01-20 LAB — WET PREP, GENITAL
Clue Cells Wet Prep HPF POC: NONE SEEN
Trich, Wet Prep: NONE SEEN

## 2011-01-20 MED ORDER — FLUCONAZOLE 150 MG PO TABS: 150.0000 mg | ORAL_TABLET | Freq: Once | ORAL | Status: AC

## 2011-01-20 NOTE — Progress Notes (Signed)
9/20 Wet prep showed TNTC yeast.  Diflucan e-prescribed.  Patient called and informed of diagnosis and treatment.  Will continue to monitor.

## 2011-01-20 NOTE — Progress Notes (Signed)
Addended by: Jaynie Collins A on: 01/20/2011 04:03 PM   Modules accepted: Orders

## 2011-02-10 ENCOUNTER — Encounter: Payer: Self-pay | Admitting: Family Medicine

## 2011-02-10 ENCOUNTER — Ambulatory Visit (INDEPENDENT_AMBULATORY_CARE_PROVIDER_SITE_OTHER): Payer: BC Managed Care – PPO | Admitting: Family Medicine

## 2011-02-10 DIAGNOSIS — Z3480 Encounter for supervision of other normal pregnancy, unspecified trimester: Secondary | ICD-10-CM

## 2011-02-10 DIAGNOSIS — O09529 Supervision of elderly multigravida, unspecified trimester: Secondary | ICD-10-CM

## 2011-02-10 DIAGNOSIS — J309 Allergic rhinitis, unspecified: Secondary | ICD-10-CM | POA: Insufficient documentation

## 2011-02-10 NOTE — Progress Notes (Signed)
Doing well.  Weight gain reviewed. Subjective:    Mikayla Snow is a 35 y.o. G2P1001 [redacted]w[redacted]d being seen today for her obstetrical visit.  Patient reports no complaints. Fetal movement: normal.  Objective:    BP 130/82  Wt 157 lb (71.215 kg)  LMP 09/06/2010  Physical Exam  Exam  FHT:  150 BPM  Uterine Size: 22 cm  Presentation: unsure     Assessment:    Pregnancy:  G2P1001    Plan:    Patient Active Problem List  Diagnoses  . ANEMIA  . ESOPHAGEAL SPASM  . Elderly multigravida with antepartum condition or complication  . Allergic rhinitis    Will get flu shot at school.  Had DTaP last year. Follow up in 4 wks.

## 2011-02-10 NOTE — Patient Instructions (Signed)
Pregnancy - Second Trimester The second trimester of pregnancy (3 to 6 months) is a period of rapid growth for you and your baby. At the end of the sixth month, your baby is about 9 inches long and weighs 1 1/2 pounds. You will begin to feel the baby move between 18 and 20 weeks of the pregnancy. This is called quickening. Weight gain is faster. A clear fluid (colostrum) may leak out of your breasts. You may feel small contractions of the womb (uterus). This is known as false labor or Braxton-Hicks contractions. This is like a practice for labor when the baby is ready to be born. Usually, the problems with morning sickness have usually passed by the end of your first trimester. Some women develop small dark blotches (called cholasma, mask of pregnancy) on their face that usually goes away after the baby is born. Exposure to the sun makes the blotches worse. Acne may also develop in some pregnant women and pregnant women who have acne, may find that it goes away. PRENATAL EXAMS  Blood work may continue to be done during prenatal exams. These tests are done to check on your health and the probable health of your baby. Blood work is used to follow your blood levels (hemoglobin). Anemia (low hemoglobin) is common during pregnancy. Iron and vitamins are given to help prevent this. You will also be checked for diabetes between 24 and 28 weeks of the pregnancy. Some of the previous blood tests may be repeated.   The size of the uterus is measured during each visit. This is to make sure that the baby is continuing to grow properly according to the dates of the pregnancy.   Your blood pressure is checked every prenatal visit. This is to make sure you are not getting toxemia.   Your urine is checked to make sure you do not have an infection, diabetes or protein in the urine.   Your weight is checked often to make sure gains are happening at the suggested rate. This is to ensure that both you and your baby are  growing normally.   Sometimes, an ultrasound is performed to confirm the proper growth and development of the baby. This is a test which bounces harmless sound waves off the baby so your caregiver can more accurately determine due dates.  Sometimes, a specialized test is done on the amniotic fluid surrounding the baby. This test is called an amniocentesis. The amniotic fluid is obtained by sticking a needle into the belly (abdomen). This is done to check the chromosomes in instances where there is a concern about possible genetic problems with the baby. It is also sometimes done near the end of pregnancy if an early delivery is required. In this case, it is done to help make sure the baby's lungs are mature enough for the baby to live outside of the womb. CHANGES OCCURING IN THE SECOND TRIMESTER OF PREGNANCY Your body goes through many changes during pregnancy. They vary from person to person. Talk to your caregiver about changes you notice that you are concerned about.  During the second trimester, you will likely have an increase in your appetite. It is normal to have cravings for certain foods. This varies from person to person and pregnancy to pregnancy.   Your lower abdomen will begin to bulge.   You may have to urinate more often because the uterus and baby are pressing on your bladder. It is also common to get more bladder infections during pregnancy (  pain with urination). You can help this by drinking lots of fluids and emptying your bladder before and after intercourse.   You may begin to get stretch marks on your hips, abdomen, and breasts. These are normal changes in the body during pregnancy. There are no exercises or medications to take that prevent this change.   You may begin to develop swollen and bulging veins (varicose veins) in your legs. Wearing support hose, elevating your feet for 15 minutes, 3 to 4 times a day and limiting salt in your diet helps lessen the problem.    Heartburn may develop as the uterus grows and pushes up against the stomach. Antacids recommended by your caregiver helps with this problem. Also, eating smaller meals 4 to 5 times a day helps.   Constipation can be treated with a stool softener or adding bulk to your diet. Drinking lots of fluids, vegetables, fruits, and whole grains are helpful.   Exercising is also helpful. If you have been very active up until your pregnancy, most of these activities can be continued during your pregnancy. If you have been less active, it is helpful to start an exercise program such as walking.   Hemorrhoids (varicose veins in the rectum) may develop at the end of the second trimester. Warm sitz baths and hemorrhoid cream recommended by your caregiver helps hemorrhoid problems.   Backaches may develop during this time of your pregnancy. Avoid heavy lifting, wear low heal shoes and practice good posture to help with backache problems.   Some pregnant women develop tingling and numbness of their hand and fingers because of swelling and tightening of ligaments in the wrist (carpel tunnel syndrome). This goes away after the baby is born.   As your breasts enlarge, you may have to get a bigger bra. Get a comfortable, cotton, support bra. Do not get a nursing bra until the last month of the pregnancy if you will be nursing the baby.   You may get a dark line from your belly button to the pubic area called the linea nigra.   You may develop rosy cheeks because of increase blood flow to the face.   You may develop spider looking lines of the face, neck, arms and chest. These go away after the baby is born.  HOME CARE INSTRUCTIONS  It is extremely important to avoid all smoking, herbs, alcohol, and un-prescribed drugs during your pregnancy. These chemicals affect the formation and growth of the baby. Avoid these chemicals throughout the pregnancy to ensure the delivery of a healthy infant.   Most of your home  care instructions are the same as suggested for the first trimester of your pregnancy. Keep your caregiver's appointments. Follow your caregiver's instructions regarding medication use, exercise and diet.   During pregnancy, you are providing food for you and your baby. Continue to eat regular, well-balanced meals. Choose foods such as meat, fish, milk and other low fat dairy products, vegetables, fruits, and whole-grain breads and cereals. Your caregiver will tell you of the ideal weight gain.   A physical sexual relationship may be continued up until near the end of pregnancy if there are no other problems. Problems could include early (premature) leaking of amniotic fluid from the membranes, vaginal bleeding, abdominal pain, or other medical or pregnancy problems.   Exercise regularly if there are no restrictions. Check with your caregiver if you are unsure of the safety of some of your exercises. The greatest weight gain will occur in the last   2 trimesters of pregnancy. Exercise will help you:   Control your weight.   Get you in shape for labor and delivery.   Lose weight after you have the baby.   Wear a good support or jogging bra for breast tenderness during pregnancy. This may help if worn during sleep. Pads or tissues may be used in the bra if you are leaking colostrum.   Do not use hot tubs, steam rooms or saunas throughout the pregnancy.   Wear your seat belt at all times when driving. This protects you and your baby if you are in an accident.   Avoid raw meat, uncooked cheese, cat litter boxes and soil used by cats. These carry germs that can cause birth defects in the baby.   The second trimester is also a good time to visit your dentist for your dental health if this has not been done yet. Getting your teeth cleaned is OK. Use a soft toothbrush. Brush gently during pregnancy.   It is easier to loose urine during pregnancy. Tightening up and strengthening the pelvic muscles will  help with this problem. Practice stopping your urination while you are going to the bathroom. These are the same muscles you need to strengthen. It is also the muscles you would use as if you were trying to stop from passing gas. You can practice tightening these muscles up 10 times a set and repeating this about 3 times per day. Once you know what muscles to tighten up, do not perform these exercises during urination. It is more likely to contribute to an infection by backing up the urine.   Ask for help if you have financial, counseling or nutritional needs during pregnancy. Your caregiver will be able to offer counseling for these needs as well as refer you for other special needs.   Your skin may become oily. If so, wash your face with mild soap, use non-greasy moisturizer and oil or cream based makeup.  MEDICATIONS AND DRUG USE IN PREGNANCY  Take prenatal vitamins as directed. The vitamin should contain 1 milligram of folic acid. Keep all vitamins out of reach of children. Only a couple vitamins or tablets containing iron may be fatal to a baby or young child when ingested.   Avoid use of all medications, including herbs, over-the-counter medications, not prescribed or suggested by your caregiver. Only take over-the-counter or prescription medicines for pain, discomfort, or fever as directed by your caregiver. Do not use aspirin.   Let your caregiver also know about herbs you may be using.   Alcohol is related to a number of birth defects. This includes fetal alcohol syndrome. All alcohol, in any form, should be avoided completely. Smoking will cause low birth rate and premature babies.   Street/illegal drugs are very harmful to the baby. They are absolutely forbidden. A baby born to an addicted mother will be addicted at birth. The baby will go through the same withdrawal an adult does.  SEEK MEDICAL CARE IF:  You have any concerns or worries during your pregnancy. It is better to call with  your questions if you feel they cannot wait, rather than worry about them.  SEEK IMMEDIATE MEDICAL CARE IF:  An unexplained oral temperature above 101 develops, or as your caregiver suggests.   You have leaking of fluid from the vagina (birth canal). If leaking membranes are suspected, take your temperature and tell your caregiver of this when you call.   There is vaginal spotting, bleeding, or passing   clots. Tell your caregiver of the amount and how many pads are used. Light spotting in pregnancy is common, especially following intercourse.   You develop a bad smelling vaginal discharge with a change in the color from clear to white.   You continue to feel sick to your stomach (nauseated) and have no relief from remedies suggested. You vomit blood or coffee ground like materials.   You loose more than 2 pounds of weight or gain more than 2 pounds of weight over a weeks time, or as suggested by your caregiver.   You notice swelling of your face, hands, and feet or legs.   You get exposed to German measles and have never had them.   You are exposed to fifth disease or chicken pox.   You develop belly (abdominal) pain. Round ligament discomfort is a common non-cancerous (benign) cause of abdominal pain in pregnancy. Your caregiver still must evaluate you.   You develop a bad headache that does not go away.   You develop fever, diarrhea, pain with urination or shortness of breath.   You develop visual problems, blurry or double vision.   You fall, are in a car accident or any kind of trauma.   There is mental or physical violence at home.  Document Released: 04/12/2001 Document Re-Released: 07/13/2009 [employer] Patient Information 2011 [employer] 

## 2011-03-08 ENCOUNTER — Ambulatory Visit (INDEPENDENT_AMBULATORY_CARE_PROVIDER_SITE_OTHER): Payer: BC Managed Care – PPO | Admitting: Family Medicine

## 2011-03-08 VITALS — BP 112/63 | Wt 162.0 lb

## 2011-03-08 DIAGNOSIS — Z3480 Encounter for supervision of other normal pregnancy, unspecified trimester: Secondary | ICD-10-CM

## 2011-03-08 DIAGNOSIS — Z23 Encounter for immunization: Secondary | ICD-10-CM

## 2011-03-08 MED ORDER — INFLUENZA VIRUS VACC SPLIT PF IM SUSP: 0.5000 mL | Freq: Once | INTRAMUSCULAR | Status: DC

## 2011-03-08 NOTE — Progress Notes (Signed)
Doing well, good fetal movement. Already had TDaP Flu today 28 wk labs today

## 2011-03-08 NOTE — Patient Instructions (Signed)
Birth Control Choices Birth control is the use of any practices, methods, or devices to prevent pregnancy from happening in a sexually active woman.  Below are some birth control choices to help avoid pregnancy.  Not having sex (abstinence) is the surest form of birth control. This requires self-control. There is no risk of acquiring a sexually transmitted disease (STD), including acquired immunodeficiency syndrome (AIDS).   Periodic abstinence requires self-control during certain times of the month.   Calendar method, timing your menstrual periods from month to month.   Ovulation method is avoiding sexual intercourse around the time you produce an egg (ovulate).   Symptotherm method is avoiding sexual intercourse at the time of ovulation, using a thermometer and ovulation symptoms.   Post ovulation method is the timing of sexual intercourse after you ovulated.  These methods do not protect against STDs, including AIDS.  Birth control pills (BCPs) contain estrogen and progesterone hormone. These medicines work by stopping the egg from forming in the ovary (ovulation). Birth control pills are prescribed by a caregiver who will ask you questions about the risks of taking BCPs. Birth control pills do not protect against STDs, including AIDS.   "Minipill" birth control pills have only the progesterone hormone. They are taken every day of each month and must be prescribed by your caregiver. They do not protect against STDs, including AIDS.   Emergency contraception is often call the "morning after" pill. This pill can be taken right after sex or up to five days after sex if you think your birth control failed, you failed to use contraception, or you were forced to have sex. It is most effective the sooner you take the pills after having sexual intercourse. Do not use emergency contraception as your only form of birth control. Emergency contraceptive pills are available without a prescription. Check  with your pharmacist.   Condoms are a thin sheath of latex, synthetic material, or lambskin worn over the penis during sexual intercourse. They can have a spermicide in or on them when you buy them. Latex condoms can prevent pregnancy and STDs. "Natural" or lambskin condoms can prevent pregnancy but may not protect against STDs, including AIDS.   Female condoms are a soft, loose-fitting sheath that is put into the vagina before sexual intercourse. They can prevent pregnancy and STDs, including AIDS.   Sponge is a soft, circular piece of polyurethane foam with spermicide in it that is inserted into the vagina after wetting it and before sexual intercourse. It does not require a prescription from your caregiver. It does not protect against STDs, including AIDS.   Diaphragm is a soft, latex, dome-shaped barrier that must be fitted by a caregiver. It is inserted into the vagina, along with a spermicidal jelly. After the proper fitting for a diaphragm, always insert the diaphragm before intercourse. The diaphragm should be left in the vagina for 6 to 8 hours after intercourse. Removal and reinsertion with a spermicide is always necessary after any use. It does not protect against STDs, including AIDS.   Progesterone-only injections are given every 3 months to prevent pregnancy. These injections contain synthetic progesterone and no estrogen. This hormone stops the ovaries from releasing eggs. It also causes the cervical mucus to thicken and changes the uterine lining. This makes it harder for sperm to survive in the uterus. It does not protect against STDs, including AIDS.   Birth Control Patch contains hormones similar to those in birth control pills, so effectiveness, risks, and side effects   are similar. It must be changed once a week and is prescribed by a caregiver. It is less effective in very overweight women. It does not protect against STDs, including AIDS.   Vaginal Ring contains hormones similar  to those in birth control pills. It is left in place for 3 weeks, removed for 1 week, and then a new one is put back into the vagina. It comes with a timer to put in your purse to help you remember when to take it out or put a new one in. A caregiver's examination and prescription is necessary, just like with birth control pills and the patch. It does not protect against STDs, including AIDS.   Estrogen plus progesterone injections are given every 28 to 30 days. They can be given in the upper arm, thigh, or buttocks. It does not protect against STDs, including AIDS.   Intrauterine device (IUD): copper T or progestin filled is a T-shaped device that is put in a woman's uterus during a menstrual period to prevent pregnancy. The copper T IUD can last 10 years, and the progestin IUD can last 5 years. The progestin IUD can also help control heavy menstrual periods. It does not protect against STDs, including AIDS. The copper T IUD can be used as emergency contraception if inserted within 5 days of having unprotected intercourse.   Cervical cap is a round, soft latex or plastic cup that fits over the cervix and must be fitted by a caregiver. You do not need to use a spermicide with it or remove and insert it every time you have sexual intercourse. It does not protect against STDs, including AIDS.   Spermicides are chemicals that kill or block sperm from entering the cervix and uterus. They come in the form of creams, jellies, suppositories, foam, or tablets, and they do not require a prescription. They are inserted into the vagina with an applicator before having sexual intercourse. This must be repeated every time you have sexual intercourse.   Withdrawal is using the method of the female withdrawing his penis from sexual intercourse before he has a climax and deposits his sperm. It does not protect against STDs, including AIDS.   Female tubal ligation is when the woman's fallopian tubes are surgically sealed  or tied to prevent the egg from traveling to the uterus. It does not protect against STDs, including AIDS.   Female sterilization is when the female has his tubes that carry sperm tied off (vasectomy) to stop sperm from entering the vagina during sexual intercourse. It does not protect against STDs, including AIDS.  Regardless of which method of birth control you choose, it is still important that you use some form of protection against STDs. Document Released: 04/18/2005 Document Revised: 05/21/2010 Document Reviewed: 03/05/2009 [employer] Patient Information 2012 [employer]. Breastfeeding BENEFITS OF BREASTFEEDING For the baby  The first milk (colostrum) helps the baby's digestive system function better.   There are antibodies from the mother in the milk that help the baby fight off infections.   The baby has a lower incidence of asthma, allergies, and SIDS (sudden infant death syndrome).   The nutrients in breast milk are better than formulas for the baby and helps the baby's brain grow better.   Babies who breastfeed have less gas, colic, and constipation.  For the mother  Breastfeeding helps develop a very special bond between mother and baby.   It is more convenient, always available at the correct temperature and cheaper than formula feeding.     It burns calories in the mother and helps with losing weight that was gained during pregnancy.   It makes the uterus contract back down to normal size faster and slows bleeding following delivery.   Breastfeeding mothers have a lower risk of developing breast cancer.  NURSE FREQUENTLY  A healthy, full-term baby may breastfeed as often as every hour or space his or her feedings to every 3 hours.   How often to nurse will vary from baby to baby. Watch your baby for signs of hunger, not the clock.   Nurse as often as the baby requests, or when you feel the need to reduce the fullness of your breasts.   Awaken the baby if it has been 3  to 4 hours since the last feeding.   Frequent feeding will help the mother make more milk and will prevent problems like sore nipples and engorgement of the breasts.  BABY'S POSITION AT THE BREAST  Whether lying down or sitting, be sure that the baby's tummy is facing your tummy.   Support the breast with 4 fingers underneath the breast and the thumb above. Make sure your fingers are well away from the nipple and baby's mouth.   Stroke the baby's lips and cheek closest to the breast gently with your finger or nipple.   When the baby's mouth is open wide enough, place all of your nipple and as much of the dark area around the nipple as possible into your baby's mouth.   Pull the baby in close so the tip of the nose and the baby's cheeks touch the breast during the feeding.  FEEDINGS  The length of each feeding varies from baby to baby and from feeding to feeding.   The baby must suck about 2 to 3 minutes for your milk to get to him or her. This is called a "let down." For this reason, allow the baby to feed on each breast as long as he or she wants. Your baby will end the feeding when he or she has received the right balance of nutrients.   To break the suction, put your finger into the corner of the baby's mouth and slide it between his or her gums before removing your breast from his or her mouth. This will help prevent sore nipples.  REDUCING BREAST ENGORGEMENT  In the first week after your baby is born, you may experience signs of breast engorgement. When breasts are engorged, they feel heavy, warm, full, and may be tender to the touch. You can reduce engorgement if you:   Nurse frequently, every 2 to 3 hours. Mothers who breastfeed early and often have fewer problems with engorgement.   Place light ice packs on your breasts between feedings. This reduces swelling. Wrap the ice packs in a lightweight towel to protect your skin.   Apply moist hot packs to your breast for 5 to 10  minutes before each feeding. This increases circulation and helps the milk flow.   Gently massage your breast before and during the feeding.   Make sure that the baby empties at least one breast at every feeding before switching sides.   Use a breast pump to empty the breasts if your baby is sleepy or not nursing well. You may also want to pump if you are returning to work or or you feel you are getting engorged.   Avoid bottle feeds, pacifiers or supplemental feedings of water or juice in place of breastfeeding.   Be sure   the baby is latched on and positioned properly while breastfeeding.   Prevent fatigue, stress, and anemia.   Wear a supportive bra, avoiding underwire styles.   Eat a balanced diet with enough fluids.  If you follow these suggestions, your engorgement should improve in 24 to 48 hours. If you are still experiencing difficulty, call your lactation consultant or caregiver. IS MY BABY GETTING ENOUGH MILK? Sometimes, mothers worry about whether their babies are getting enough milk. You can be assured that your baby is getting enough milk if:  The baby is actively sucking and you hear swallowing.   The baby nurses at least 8 to 12 times in a 24 hour time period. Nurse your baby until he or she unlatches or falls asleep at the first breast (at least 10 to 20 minutes), then offer the second side.   The baby is wetting 5 to 6 disposable diapers (6 to 8 cloth diapers) in a 24 hour period by 5 to 6 days of age.   The baby is having at least 2 to 3 stools every 24 hours for the first few months. Breast milk is all the food your baby needs. It is not necessary for your baby to have water or formula. In fact, to help your breasts make more milk, it is best not to give your baby supplemental feedings during the early weeks.   The stool should be soft and yellow.   The baby should gain 4 to 7 ounces per week after he is 4 days old.  TAKE CARE OF YOURSELF Take care of your breasts  by:  Bathing or showering daily.   Avoiding the use of soaps on your nipples.   Start feedings on your left breast at one feeding and on your right breast at the next feeding.   You will notice an increase in your milk supply 2 to 5 days after delivery. You may feel some discomfort from engorgement, which makes your breasts very firm and often tender. Engorgement "peaks" out within 24 to 48 hours. In the meantime, apply warm moist towels to your breasts for 5 to 10 minutes before feeding. Gentle massage and expression of some milk before feeding will soften your breasts, making it easier for your baby to latch on. Wear a well fitting nursing bra and air dry your nipples for 10 to 15 minutes after each feeding.   Only use cotton bra pads.   Only use pure lanolin on your nipples after nursing. You do not need to wash it off before nursing.  Take care of yourself by:   Eating well-balanced meals and nutritious snacks.   Drinking milk, fruit juice, and water to satisfy your thirst (about 8 glasses a day).   Getting plenty of rest.   Increasing calcium in your diet (1200 mg a day).   Avoiding foods that you notice affect the baby in a bad way.  SEEK MEDICAL CARE IF:   You have any questions or difficulty with breastfeeding.   You need help.   You have a hard, red, sore area on your breast, accompanied by a fever of 100.5 F (38.1 C) or more.   Your baby is too sleepy to eat well or is having trouble sleeping.   Your baby is wetting less than 6 diapers per day, by 5 days of age.   Your baby's skin or white part of his or her eyes is more yellow than it was in the hospital.   You feel   depressed.  Document Released: 04/18/2005 Document Revised: 12/29/2010 Document Reviewed: 12/01/2008 [employer] Patient Information 2012 [employer]. 

## 2011-03-09 LAB — RPR

## 2011-03-09 LAB — CBC
MCV: 89.7 fL (ref 78.0–100.0)
Platelets: 199 10*3/uL (ref 150–400)
RBC: 3.69 MIL/uL — ABNORMAL LOW (ref 3.87–5.11)
WBC: 8.3 10*3/uL (ref 4.0–10.5)

## 2011-03-09 LAB — GLUCOSE TOLERANCE, 1 HOUR: Glucose, 1 Hour GTT: 124 mg/dL (ref 70–140)

## 2011-03-28 ENCOUNTER — Ambulatory Visit (INDEPENDENT_AMBULATORY_CARE_PROVIDER_SITE_OTHER): Payer: BC Managed Care – PPO | Admitting: Obstetrics & Gynecology

## 2011-03-28 ENCOUNTER — Inpatient Hospital Stay (HOSPITAL_COMMUNITY)
Admission: AD | Admit: 2011-03-28 | Discharge: 2011-03-28 | Disposition: A | Discharge status: Home / Self Care | Payer: BC Managed Care – PPO | Source: Ambulatory Visit | Attending: Obstetrics & Gynecology | Admitting: Obstetrics & Gynecology

## 2011-03-28 ENCOUNTER — Inpatient Hospital Stay (HOSPITAL_COMMUNITY): Payer: BC Managed Care – PPO

## 2011-03-28 DIAGNOSIS — O4693 Antepartum hemorrhage, unspecified, third trimester: Secondary | ICD-10-CM

## 2011-03-28 DIAGNOSIS — O26859 Spotting complicating pregnancy, unspecified trimester: Secondary | ICD-10-CM | POA: Insufficient documentation

## 2011-03-28 DIAGNOSIS — O360990 Maternal care for other rhesus isoimmunization, unspecified trimester, not applicable or unspecified: Secondary | ICD-10-CM

## 2011-03-28 DIAGNOSIS — Z3480 Encounter for supervision of other normal pregnancy, unspecified trimester: Secondary | ICD-10-CM

## 2011-03-28 DIAGNOSIS — O409XX0 Polyhydramnios, unspecified trimester, not applicable or unspecified: Secondary | ICD-10-CM

## 2011-03-28 DIAGNOSIS — O403XX0 Polyhydramnios, third trimester, not applicable or unspecified: Secondary | ICD-10-CM | POA: Insufficient documentation

## 2011-03-28 DIAGNOSIS — O4690 Antepartum hemorrhage, unspecified, unspecified trimester: Secondary | ICD-10-CM

## 2011-03-28 DIAGNOSIS — O26899 Other specified pregnancy related conditions, unspecified trimester: Secondary | ICD-10-CM

## 2011-03-28 MED ORDER — RHO D IMMUNE GLOBULIN 1500 UNIT/2ML IJ SOLN
300.0000 ug | Freq: Once | INTRAMUSCULAR | Status: AC
  Administered 2011-03-28: 300 ug via INTRAMUSCULAR

## 2011-03-28 NOTE — Progress Notes (Signed)
Addended by: Barbara Cower on: 03/28/2011 06:16 PM   Modules accepted: Orders

## 2011-03-28 NOTE — ED Notes (Signed)
Pt in triage room.  Dr Macon Large in to see pt.

## 2011-03-28 NOTE — Progress Notes (Signed)
Addended by: Barbara Cower on: 03/28/2011 06:19 PM   Modules accepted: Level of Service

## 2011-03-28 NOTE — Progress Notes (Signed)
Pt G2 P1, had an episode of spotting today.  Seen at the office today, sent to [facility] for BPP.

## 2011-03-28 NOTE — Progress Notes (Signed)
Patient seen in [facility] today; BPP 8/8, normal placenta.  AFI 25.74, technically polyhydramnios, will reevaluate with BPP and AFI next week as per protocol.  No further spotting or other complaints.  Fetal movement and labor precautions reviewed.

## 2011-03-28 NOTE — Progress Notes (Signed)
She needs her Rhophylac today. She had a "teensy" bit of blood today but no ROM, CTXs, or sex recently.  On exam her cervix is entirely normal. I did not see any blood. Her cervix is closed and thick.

## 2011-03-28 NOTE — Progress Notes (Signed)
Addended by: Jaynie Collins A on: 03/28/2011 08:51 PM   Modules accepted: Orders

## 2011-03-28 NOTE — ED Provider Notes (Signed)
Patient sent for BPP in the setting of reported spotting.  Negative exam in office. No further bleeding. BPP 8/8, normal placenta but showed AFI of 25.74.  Will reevaluate in one week.  Fetal movement and preterm precautions reviewed.  Jaynie Collins, M.D. 03/28/2011 8:38 PM

## 2011-04-05 ENCOUNTER — Ambulatory Visit (INDEPENDENT_AMBULATORY_CARE_PROVIDER_SITE_OTHER): Payer: BC Managed Care – PPO | Admitting: Family Medicine

## 2011-04-05 DIAGNOSIS — O409XX0 Polyhydramnios, unspecified trimester, not applicable or unspecified: Secondary | ICD-10-CM

## 2011-04-05 DIAGNOSIS — O09529 Supervision of elderly multigravida, unspecified trimester: Secondary | ICD-10-CM

## 2011-04-05 DIAGNOSIS — Z3480 Encounter for supervision of other normal pregnancy, unspecified trimester: Secondary | ICD-10-CM

## 2011-04-05 NOTE — Progress Notes (Signed)
Feels breech--Had bleeding last week, everything has been fine, no bleeding, no ctx's, no LOF, excellent fetal movement, will repeat her AFI

## 2011-04-05 NOTE — Patient Instructions (Signed)
Pregnancy - Third Trimester The third trimester of pregnancy (the last 3 months) is a period of the most rapid growth for you and your baby. The baby approaches a length of 20 inches and a weight of 6 to 10 pounds. The baby is adding on fat and getting ready for life outside your body. While inside, babies have periods of sleeping and waking, suck their thumbs, and hiccups. You can often feel small contractions of the uterus. This is false labor. It is also called Braxton-Hicks contractions. This is like a practice for labor. The usual problems in this stage of pregnancy include more difficulty breathing, swelling of the hands and feet from water retention, and having to urinate more often because of the uterus and baby pressing on your bladder.  PRENATAL EXAMS  Blood work may continue to be done during prenatal exams. These tests are done to check on your health and the probable health of your baby. Blood work is used to follow your blood levels (hemoglobin). Anemia (low hemoglobin) is common during pregnancy. Iron and vitamins are given to help prevent this. You may also continue to be checked for diabetes. Some of the past blood tests may be done again.   The size of the uterus is measured during each visit. This makes sure your baby is growing properly according to your pregnancy dates.   Your blood pressure is checked every prenatal visit. This is to make sure you are not getting toxemia.   Your urine is checked every prenatal visit for infection, diabetes and protein.   Your weight is checked at each visit. This is done to make sure gains are happening at the suggested rate and that you and your baby are growing normally.   Sometimes, an ultrasound is performed to confirm the position and the proper growth and development of the baby. This is a test done that bounces harmless sound waves off the baby so your caregiver can more accurately determine due dates.   Discuss the type of pain  medication and anesthesia you will have during your labor and delivery.   Discuss the possibility and anesthesia if a Cesarean Section might be necessary.   Inform your caregiver if there is any mental or physical violence at home.  Sometimes, a specialized non-stress test, contraction stress test and biophysical profile are done to make sure the baby is not having a problem. Checking the amniotic fluid surrounding the baby is called an amniocentesis. The amniotic fluid is removed by sticking a needle into the belly (abdomen). This is sometimes done near the end of pregnancy if an early delivery is required. In this case, it is done to help make sure the baby's lungs are mature enough for the baby to live outside of the womb. If the lungs are not mature and it is unsafe to deliver the baby, an injection of cortisone medication is given to the mother 1 to 2 days before the delivery. This helps the baby's lungs mature and makes it safer to deliver the baby. CHANGES OCCURING IN THE THIRD TRIMESTER OF PREGNANCY Your body goes through many changes during pregnancy. They vary from person to person. Talk to your caregiver about changes you notice and are concerned about.  During the last trimester, you have probably had an increase in your appetite. It is normal to have cravings for certain foods. This varies from person to person and pregnancy to pregnancy.   You may begin to get stretch marks on your hips,   abdomen, and breasts. These are normal changes in the body during pregnancy. There are no exercises or medications to take which prevent this change.   Constipation may be treated with a stool softener or adding bulk to your diet. Drinking lots of fluids, fiber in vegetables, fruits, and whole grains are helpful.   Exercising is also helpful. If you have been very active up until your pregnancy, most of these activities can be continued during your pregnancy. If you have been less active, it is helpful  to start an exercise program such as walking. Consult your caregiver before starting exercise programs.   Avoid all smoking, alcohol, un-prescribed drugs, herbs and "street drugs" during your pregnancy. These chemicals affect the formation and growth of the baby. Avoid chemicals throughout the pregnancy to ensure the delivery of a healthy infant.   Backache, varicose veins and hemorrhoids may develop or get worse.   You will tire more easily in the third trimester, which is normal.   The baby's movements may be stronger and more often.   You may become short of breath easily.   Your belly button may stick out.   A yellow discharge may leak from your breasts called colostrum.   You may have a bloody mucus discharge. This usually occurs a few days to a week before labor begins.  HOME CARE INSTRUCTIONS   Keep your caregiver's appointments. Follow your caregiver's instructions regarding medication use, exercise, and diet.   During pregnancy, you are providing food for you and your baby. Continue to eat regular, well-balanced meals. Choose foods such as meat, fish, milk and other low fat dairy products, vegetables, fruits, and whole-grain breads and cereals. Your caregiver will tell you of the ideal weight gain.   A physical sexual relationship may be continued throughout pregnancy if there are no other problems such as early (premature) leaking of amniotic fluid from the membranes, vaginal bleeding, or belly (abdominal) pain.   Exercise regularly if there are no restrictions. Check with your caregiver if you are unsure of the safety of your exercises. Greater weight gain will occur in the last 2 trimesters of pregnancy. Exercising helps:   Control your weight.   Get you in shape for labor and delivery.   You lose weight after you deliver.   Rest a lot with legs elevated, or as needed for leg cramps or low back pain.   Wear a good support or jogging bra for breast tenderness during  pregnancy. This may help if worn during sleep. Pads or tissues may be used in the bra if you are leaking colostrum.   Do not use hot tubs, steam rooms, or saunas.   Wear your seat belt when driving. This protects you and your baby if you are in an accident.   Avoid raw meat, cat litter boxes and soil used by cats. These carry germs that can cause birth defects in the baby.   It is easier to loose urine during pregnancy. Tightening up and strengthening the pelvic muscles will help with this problem. You can practice stopping your urination while you are going to the bathroom. These are the same muscles you need to strengthen. It is also the muscles you would use if you were trying to stop from passing gas. You can practice tightening these muscles up 10 times a set and repeating this about 3 times per day. Once you know what muscles to tighten up, do not perform these exercises during urination. It is more likely   to cause an infection by backing up the urine.   Ask for help if you have financial, counseling or nutritional needs during pregnancy. Your caregiver will be able to offer counseling for these needs as well as refer you for other special needs.   Make a list of emergency phone numbers and have them available.   Plan on getting help from family or friends when you go home from the hospital.   Make a trial run to the hospital.   Take prenatal classes with the father to understand, practice and ask questions about the labor and delivery.   Prepare the baby's room/nursery.   Do not travel out of the city unless it is absolutely necessary and with the advice of your caregiver.   Wear only low or no heal shoes to have better balance and prevent falling.  MEDICATIONS AND DRUG USE IN PREGNANCY  Take prenatal vitamins as directed. The vitamin should contain 1 milligram of folic acid. Keep all vitamins out of reach of children. Only a couple vitamins or tablets containing iron may be fatal  to a baby or young child when ingested.   Avoid use of all medications, including herbs, over-the-counter medications, not prescribed or suggested by your caregiver. Only take over-the-counter or prescription medicines for pain, discomfort, or fever as directed by your caregiver. Do not use aspirin, ibuprofen (Motrin, Advil, Nuprin) or naproxen (Aleve) unless OK'd by your caregiver.   Let your caregiver also know about herbs you may be using.   Alcohol is related to a number of birth defects. This includes fetal alcohol syndrome. All alcohol, in any form, should be avoided completely. Smoking will cause low birth rate and premature babies.   Street/illegal drugs are very harmful to the baby. They are absolutely forbidden. A baby born to an addicted mother will be addicted at birth. The baby will go through the same withdrawal an adult does.  SEEK MEDICAL CARE IF: You have any concerns or worries during your pregnancy. It is better to call with your questions if you feel they cannot wait, rather than worry about them. DECISIONS ABOUT CIRCUMCISION You may or may not know the sex of your baby. If you know your baby is a boy, it may be time to think about circumcision. Circumcision is the removal of the foreskin of the penis. This is the skin that covers the sensitive end of the penis. There is no proven medical need for this. Often this decision is made on what is popular at the time or based upon religious beliefs and social issues. You can discuss these issues with your caregiver or pediatrician. SEEK IMMEDIATE MEDICAL CARE IF:   An unexplained oral temperature above 102 F (38.9 C) develops, or as your caregiver suggests.   You have leaking of fluid from the vagina (birth canal). If leaking membranes are suspected, take your temperature and tell your caregiver of this when you call.   There is vaginal spotting, bleeding or passing clots. Tell your caregiver of the amount and how many pads are  used.   You develop a bad smelling vaginal discharge with a change in the color from clear to white.   You develop vomiting that lasts more than 24 hours.   You develop chills or fever.   You develop shortness of breath.   You develop burning on urination.   You loose more than 2 pounds of weight or gain more than 2 pounds of weight or as suggested by your   caregiver.   You notice sudden swelling of your face, hands, and feet or legs.   You develop belly (abdominal) pain. Round ligament discomfort is a common non-cancerous (benign) cause of abdominal pain in pregnancy. Your caregiver still must evaluate you.   You develop a severe headache that does not go away.   You develop visual problems, blurred or double vision.   If you have not felt your baby move for more than 1 hour. If you think the baby is not moving as much as usual, eat something with sugar in it and lie down on your left side for an hour. The baby should move at least 4 to 5 times per hour. Call right away if your baby moves less than that.   You fall, are in a car accident or any kind of trauma.   There is mental or physical violence at home.  Document Released: 04/12/2001 Document Revised: 12/29/2010 Document Reviewed: 10/15/2008 [employer] Patient Information 2012 [employer]. Birth Control Choices Birth control is the use of any practices, methods, or devices to prevent pregnancy from happening in a sexually active woman.  Below are some birth control choices to help avoid pregnancy.  Not having sex (abstinence) is the surest form of birth control. This requires self-control. There is no risk of acquiring a sexually transmitted disease (STD), including acquired immunodeficiency syndrome (AIDS).   Periodic abstinence requires self-control during certain times of the month.   Calendar method, timing your menstrual periods from month to month.   Ovulation method is avoiding sexual intercourse around the time  you produce an egg (ovulate).   Symptotherm method is avoiding sexual intercourse at the time of ovulation, using a thermometer and ovulation symptoms.   Post ovulation method is the timing of sexual intercourse after you ovulated.  These methods do not protect against STDs, including AIDS.  Birth control pills (BCPs) contain estrogen and progesterone hormone. These medicines work by stopping the egg from forming in the ovary (ovulation). Birth control pills are prescribed by a caregiver who will ask you questions about the risks of taking BCPs. Birth control pills do not protect against STDs, including AIDS.   "Minipill" birth control pills have only the progesterone hormone. They are taken every day of each month and must be prescribed by your caregiver. They do not protect against STDs, including AIDS.   Emergency contraception is often call the "morning after" pill. This pill can be taken right after sex or up to five days after sex if you think your birth control failed, you failed to use contraception, or you were forced to have sex. It is most effective the sooner you take the pills after having sexual intercourse. Do not use emergency contraception as your only form of birth control. Emergency contraceptive pills are available without a prescription. Check with your pharmacist.   Condoms are a thin sheath of latex, synthetic material, or lambskin worn over the penis during sexual intercourse. They can have a spermicide in or on them when you buy them. Latex condoms can prevent pregnancy and STDs. "Natural" or lambskin condoms can prevent pregnancy but may not protect against STDs, including AIDS.   Female condoms are a soft, loose-fitting sheath that is put into the vagina before sexual intercourse. They can prevent pregnancy and STDs, including AIDS.   Sponge is a soft, circular piece of polyurethane foam with spermicide in it that is inserted into the vagina after wetting it and before  sexual intercourse. It does   not require a prescription from your caregiver. It does not protect against STDs, including AIDS.   Diaphragm is a soft, latex, dome-shaped barrier that must be fitted by a caregiver. It is inserted into the vagina, along with a spermicidal jelly. After the proper fitting for a diaphragm, always insert the diaphragm before intercourse. The diaphragm should be left in the vagina for 6 to 8 hours after intercourse. Removal and reinsertion with a spermicide is always necessary after any use. It does not protect against STDs, including AIDS.   Progesterone-only injections are given every 3 months to prevent pregnancy. These injections contain synthetic progesterone and no estrogen. This hormone stops the ovaries from releasing eggs. It also causes the cervical mucus to thicken and changes the uterine lining. This makes it harder for sperm to survive in the uterus. It does not protect against STDs, including AIDS.   Birth Control Patch contains hormones similar to those in birth control pills, so effectiveness, risks, and side effects are similar. It must be changed once a week and is prescribed by a caregiver. It is less effective in very overweight women. It does not protect against STDs, including AIDS.   Vaginal Ring contains hormones similar to those in birth control pills. It is left in place for 3 weeks, removed for 1 week, and then a new one is put back into the vagina. It comes with a timer to put in your purse to help you remember when to take it out or put a new one in. A caregiver's examination and prescription is necessary, just like with birth control pills and the patch. It does not protect against STDs, including AIDS.   Estrogen plus progesterone injections are given every 28 to 30 days. They can be given in the upper arm, thigh, or buttocks. It does not protect against STDs, including AIDS.   Intrauterine device (IUD): copper T or progestin filled is a T-shaped  device that is put in a woman's uterus during a menstrual period to prevent pregnancy. The copper T IUD can last 10 years, and the progestin IUD can last 5 years. The progestin IUD can also help control heavy menstrual periods. It does not protect against STDs, including AIDS. The copper T IUD can be used as emergency contraception if inserted within 5 days of having unprotected intercourse.   Cervical cap is a round, soft latex or plastic cup that fits over the cervix and must be fitted by a caregiver. You do not need to use a spermicide with it or remove and insert it every time you have sexual intercourse. It does not protect against STDs, including AIDS.   Spermicides are chemicals that kill or block sperm from entering the cervix and uterus. They come in the form of creams, jellies, suppositories, foam, or tablets, and they do not require a prescription. They are inserted into the vagina with an applicator before having sexual intercourse. This must be repeated every time you have sexual intercourse.   Withdrawal is using the method of the female withdrawing his penis from sexual intercourse before he has a climax and deposits his sperm. It does not protect against STDs, including AIDS.   Female tubal ligation is when the woman's fallopian tubes are surgically sealed or tied to prevent the egg from traveling to the uterus. It does not protect against STDs, including AIDS.   Female sterilization is when the female has his tubes that carry sperm tied off (vasectomy) to stop sperm from entering the   vagina during sexual intercourse. It does not protect against STDs, including AIDS.  Regardless of which method of birth control you choose, it is still important that you use some form of protection against STDs. Document Released: 04/18/2005 Document Revised: 05/21/2010 Document Reviewed: 03/05/2009 [employer] Patient Information 2012 [employer]. Breastfeeding BENEFITS OF BREASTFEEDING For the baby  The  first milk (colostrum) helps the baby's digestive system function better.   There are antibodies from the mother in the milk that help the baby fight off infections.   The baby has a lower incidence of asthma, allergies, and SIDS (sudden infant death syndrome).   The nutrients in breast milk are better than formulas for the baby and helps the baby's brain grow better.   Babies who breastfeed have less gas, colic, and constipation.  For the mother  Breastfeeding helps develop a very special bond between mother and baby.   It is more convenient, always available at the correct temperature and cheaper than formula feeding.   It burns calories in the mother and helps with losing weight that was gained during pregnancy.   It makes the uterus contract back down to normal size faster and slows bleeding following delivery.   Breastfeeding mothers have a lower risk of developing breast cancer.  NURSE FREQUENTLY  A healthy, full-term baby may breastfeed as often as every hour or space his or her feedings to every 3 hours.   How often to nurse will vary from baby to baby. Watch your baby for signs of hunger, not the clock.   Nurse as often as the baby requests, or when you feel the need to reduce the fullness of your breasts.   Awaken the baby if it has been 3 to 4 hours since the last feeding.   Frequent feeding will help the mother make more milk and will prevent problems like sore nipples and engorgement of the breasts.  BABY'S POSITION AT THE BREAST  Whether lying down or sitting, be sure that the baby's tummy is facing your tummy.   Support the breast with 4 fingers underneath the breast and the thumb above. Make sure your fingers are well away from the nipple and baby's mouth.   Stroke the baby's lips and cheek closest to the breast gently with your finger or nipple.   When the baby's mouth is open wide enough, place all of your nipple and as much of the dark area around the nipple  as possible into your baby's mouth.   Pull the baby in close so the tip of the nose and the baby's cheeks touch the breast during the feeding.  FEEDINGS  The length of each feeding varies from baby to baby and from feeding to feeding.   The baby must suck about 2 to 3 minutes for your milk to get to him or her. This is called a "let down." For this reason, allow the baby to feed on each breast as long as he or she wants. Your baby will end the feeding when he or she has received the right balance of nutrients.   To break the suction, put your finger into the corner of the baby's mouth and slide it between his or her gums before removing your breast from his or her mouth. This will help prevent sore nipples.  REDUCING BREAST ENGORGEMENT  In the first week after your baby is born, you may experience signs of breast engorgement. When breasts are engorged, they feel heavy, warm, full, and may   be tender to the touch. You can reduce engorgement if you:   Nurse frequently, every 2 to 3 hours. Mothers who breastfeed early and often have fewer problems with engorgement.   Place light ice packs on your breasts between feedings. This reduces swelling. Wrap the ice packs in a lightweight towel to protect your skin.   Apply moist hot packs to your breast for 5 to 10 minutes before each feeding. This increases circulation and helps the milk flow.   Gently massage your breast before and during the feeding.   Make sure that the baby empties at least one breast at every feeding before switching sides.   Use a breast pump to empty the breasts if your baby is sleepy or not nursing well. You may also want to pump if you are returning to work or or you feel you are getting engorged.   Avoid bottle feeds, pacifiers or supplemental feedings of water or juice in place of breastfeeding.   Be sure the baby is latched on and positioned properly while breastfeeding.   Prevent fatigue, stress, and anemia.   Wear  a supportive bra, avoiding underwire styles.   Eat a balanced diet with enough fluids.  If you follow these suggestions, your engorgement should improve in 24 to 48 hours. If you are still experiencing difficulty, call your lactation consultant or caregiver. IS MY BABY GETTING ENOUGH MILK? Sometimes, mothers worry about whether their babies are getting enough milk. You can be assured that your baby is getting enough milk if:  The baby is actively sucking and you hear swallowing.   The baby nurses at least 8 to 12 times in a 24 hour time period. Nurse your baby until he or she unlatches or falls asleep at the first breast (at least 10 to 20 minutes), then offer the second side.   The baby is wetting 5 to 6 disposable diapers (6 to 8 cloth diapers) in a 24 hour period by 5 to 6 days of age.   The baby is having at least 2 to 3 stools every 24 hours for the first few months. Breast milk is all the food your baby needs. It is not necessary for your baby to have water or formula. In fact, to help your breasts make more milk, it is best not to give your baby supplemental feedings during the early weeks.   The stool should be soft and yellow.   The baby should gain 4 to 7 ounces per week after he is 4 days old.  TAKE CARE OF YOURSELF Take care of your breasts by:  Bathing or showering daily.   Avoiding the use of soaps on your nipples.   Start feedings on your left breast at one feeding and on your right breast at the next feeding.   You will notice an increase in your milk supply 2 to 5 days after delivery. You may feel some discomfort from engorgement, which makes your breasts very firm and often tender. Engorgement "peaks" out within 24 to 48 hours. In the meantime, apply warm moist towels to your breasts for 5 to 10 minutes before feeding. Gentle massage and expression of some milk before feeding will soften your breasts, making it easier for your baby to latch on. Wear a well fitting nursing  bra and air dry your nipples for 10 to 15 minutes after each feeding.   Only use cotton bra pads.   Only use pure lanolin on your nipples after nursing. You   do not need to wash it off before nursing.  Take care of yourself by:   Eating well-balanced meals and nutritious snacks.   Drinking milk, fruit juice, and water to satisfy your thirst (about 8 glasses a day).   Getting plenty of rest.   Increasing calcium in your diet (1200 mg a day).   Avoiding foods that you notice affect the baby in a bad way.  SEEK MEDICAL CARE IF:   You have any questions or difficulty with breastfeeding.   You need help.   You have a hard, red, sore area on your breast, accompanied by a fever of 100.5 F (38.1 C) or more.   Your baby is too sleepy to eat well or is having trouble sleeping.   Your baby is wetting less than 6 diapers per day, by 5 days of age.   Your baby's skin or white part of his or her eyes is more yellow than it was in the hospital.   You feel depressed.  Document Released: 04/18/2005 Document Revised: 12/29/2010 Document Reviewed: 12/01/2008 [employer] Patient Information 2012 [employer]. 

## 2011-04-19 ENCOUNTER — Ambulatory Visit (INDEPENDENT_AMBULATORY_CARE_PROVIDER_SITE_OTHER): Payer: BC Managed Care – PPO | Admitting: Obstetrics and Gynecology

## 2011-04-19 DIAGNOSIS — O409XX0 Polyhydramnios, unspecified trimester, not applicable or unspecified: Secondary | ICD-10-CM

## 2011-04-19 DIAGNOSIS — O403XX0 Polyhydramnios, third trimester, not applicable or unspecified: Secondary | ICD-10-CM

## 2011-04-19 DIAGNOSIS — O09529 Supervision of elderly multigravida, unspecified trimester: Secondary | ICD-10-CM

## 2011-04-19 DIAGNOSIS — J309 Allergic rhinitis, unspecified: Secondary | ICD-10-CM

## 2011-04-19 NOTE — Progress Notes (Signed)
Patient doing well without complaints. Patient reports receiving a $1900 bill for recent ultrasound and is not interested in continuing weekly BPP and AFI due to polyhydramnios. For the same reason, patient is undecided re: twice weekly fetal testing. Patient will discuss with husband and let us know. FM/PTL precautions reviewed.

## 2011-04-19 NOTE — Progress Notes (Signed)
Patient doing well. FM/PTL precautions reviewed. Patient declined repeat BPP/AFI due to polyhydramnios. Discussed the need for twice weekly NST. Patient states that she will discuss with her husband due to increased cost accrued with the multiple ultrasounds. Patient was advised to monitor FM.

## 2011-05-03 NOTE — L&D Delivery Note (Cosign Needed)
Delivery Note At 4:24 AM a viable female was delivered via Vaginal, Spontaneous Delivery (Presentation: Middle Occiput Anterior).  APGAR: 9, 9; weight 9 lb 0.3 oz (4090 g).   Placenta status: Intact, Spontaneous.  Cord: 3 vessels with the following complications: None.  Cord pH: not indicated  Anesthesia: Epidural  Episiotomy: None Lacerations: 2nd degree;Perineal Suture Repair: 3.0 vicryl Est. Blood Loss (mL): 400 ml  Family bonding well, infant skin-to-skin. Mom to postpartum.  Baby to nursery-stable.   LEFTWICH-KIRBY, Mikayla Snow 06/16/2011, 4:56 AM

## 2011-05-05 ENCOUNTER — Ambulatory Visit (INDEPENDENT_AMBULATORY_CARE_PROVIDER_SITE_OTHER): Payer: BC Managed Care – PPO | Admitting: Obstetrics & Gynecology

## 2011-05-05 VITALS — BP 130/67 | Wt 171.0 lb

## 2011-05-05 DIAGNOSIS — Z3480 Encounter for supervision of other normal pregnancy, unspecified trimester: Secondary | ICD-10-CM

## 2011-05-05 NOTE — Progress Notes (Signed)
She has no complaints. Good FM, no VB, ROM, or CTXs. She does not want twice weekly testing. She will get her cervical cultures next week. She denies HA, visual changes, or RUQ pain. NST (free) reactive today.

## 2011-05-11 ENCOUNTER — Other Ambulatory Visit: Payer: BC Managed Care – PPO

## 2011-05-14 ENCOUNTER — Other Ambulatory Visit: Payer: Self-pay | Admitting: Internal Medicine

## 2011-05-16 ENCOUNTER — Ambulatory Visit (INDEPENDENT_AMBULATORY_CARE_PROVIDER_SITE_OTHER): Payer: BC Managed Care – PPO | Admitting: Family Medicine

## 2011-05-16 DIAGNOSIS — Z3480 Encounter for supervision of other normal pregnancy, unspecified trimester: Secondary | ICD-10-CM

## 2011-05-16 DIAGNOSIS — O409XX0 Polyhydramnios, unspecified trimester, not applicable or unspecified: Secondary | ICD-10-CM

## 2011-05-16 DIAGNOSIS — O403XX0 Polyhydramnios, third trimester, not applicable or unspecified: Secondary | ICD-10-CM

## 2011-05-16 NOTE — Patient Instructions (Signed)
Birth Control Choices Birth control is the use of any practices, methods, or devices to prevent pregnancy from happening in a sexually active woman.  Below are some birth control choices to help avoid pregnancy.  Not having sex (abstinence) is the surest form of birth control. This requires self-control. There is no risk of acquiring a sexually transmitted disease (STD), including acquired immunodeficiency syndrome (AIDS).   Periodic abstinence requires self-control during certain times of the month.   Calendar method, timing your menstrual periods from month to month.   Ovulation method is avoiding sexual intercourse around the time you produce an egg (ovulate).   Symptotherm method is avoiding sexual intercourse at the time of ovulation, using a thermometer and ovulation symptoms.   Post ovulation method is the timing of sexual intercourse after you ovulated.  These methods do not protect against STDs, including AIDS.  Birth control pills (BCPs) contain estrogen and progesterone hormone. These medicines work by stopping the egg from forming in the ovary (ovulation). Birth control pills are prescribed by a caregiver who will ask you questions about the risks of taking BCPs. Birth control pills do not protect against STDs, including AIDS.   "Minipill" birth control pills have only the progesterone hormone. They are taken every day of each month and must be prescribed by your caregiver. They do not protect against STDs, including AIDS.   Emergency contraception is often call the "morning after" pill. This pill can be taken right after sex or up to five days after sex if you think your birth control failed, you failed to use contraception, or you were forced to have sex. It is most effective the sooner you take the pills after having sexual intercourse. Do not use emergency contraception as your only form of birth control. Emergency contraceptive pills are available without a prescription. Check  with your pharmacist.   Condoms are a thin sheath of latex, synthetic material, or lambskin worn over the penis during sexual intercourse. They can have a spermicide in or on them when you buy them. Latex condoms can prevent pregnancy and STDs. "Natural" or lambskin condoms can prevent pregnancy but may not protect against STDs, including AIDS.   Female condoms are a soft, loose-fitting sheath that is put into the vagina before sexual intercourse. They can prevent pregnancy and STDs, including AIDS.   Sponge is a soft, circular piece of polyurethane foam with spermicide in it that is inserted into the vagina after wetting it and before sexual intercourse. It does not require a prescription from your caregiver. It does not protect against STDs, including AIDS.   Diaphragm is a soft, latex, dome-shaped barrier that must be fitted by a caregiver. It is inserted into the vagina, along with a spermicidal jelly. After the proper fitting for a diaphragm, always insert the diaphragm before intercourse. The diaphragm should be left in the vagina for 6 to 8 hours after intercourse. Removal and reinsertion with a spermicide is always necessary after any use. It does not protect against STDs, including AIDS.   Progesterone-only injections are given every 3 months to prevent pregnancy. These injections contain synthetic progesterone and no estrogen. This hormone stops the ovaries from releasing eggs. It also causes the cervical mucus to thicken and changes the uterine lining. This makes it harder for sperm to survive in the uterus. It does not protect against STDs, including AIDS.   Birth Control Patch contains hormones similar to those in birth control pills, so effectiveness, risks, and side effects   are similar. It must be changed once a week and is prescribed by a caregiver. It is less effective in very overweight women. It does not protect against STDs, including AIDS.   Vaginal Ring contains hormones similar  to those in birth control pills. It is left in place for 3 weeks, removed for 1 week, and then a new one is put back into the vagina. It comes with a timer to put in your purse to help you remember when to take it out or put a new one in. A caregiver's examination and prescription is necessary, just like with birth control pills and the patch. It does not protect against STDs, including AIDS.   Estrogen plus progesterone injections are given every 28 to 30 days. They can be given in the upper arm, thigh, or buttocks. It does not protect against STDs, including AIDS.   Intrauterine device (IUD): copper T or progestin filled is a T-shaped device that is put in a woman's uterus during a menstrual period to prevent pregnancy. The copper T IUD can last 10 years, and the progestin IUD can last 5 years. The progestin IUD can also help control heavy menstrual periods. It does not protect against STDs, including AIDS. The copper T IUD can be used as emergency contraception if inserted within 5 days of having unprotected intercourse.   Cervical cap is a round, soft latex or plastic cup that fits over the cervix and must be fitted by a caregiver. You do not need to use a spermicide with it or remove and insert it every time you have sexual intercourse. It does not protect against STDs, including AIDS.   Spermicides are chemicals that kill or block sperm from entering the cervix and uterus. They come in the form of creams, jellies, suppositories, foam, or tablets, and they do not require a prescription. They are inserted into the vagina with an applicator before having sexual intercourse. This must be repeated every time you have sexual intercourse.   Withdrawal is using the method of the female withdrawing his penis from sexual intercourse before he has a climax and deposits his sperm. It does not protect against STDs, including AIDS.   Female tubal ligation is when the woman's fallopian tubes are surgically sealed  or tied to prevent the egg from traveling to the uterus. It does not protect against STDs, including AIDS.   Female sterilization is when the female has his tubes that carry sperm tied off (vasectomy) to stop sperm from entering the vagina during sexual intercourse. It does not protect against STDs, including AIDS.  Regardless of which method of birth control you choose, it is still important that you use some form of protection against STDs. Document Released: 04/18/2005 Document Revised: 05/21/2010 Document Reviewed: 03/05/2009 [employer] Patient Information 2012 [employer]. Breastfeeding BENEFITS OF BREASTFEEDING For the baby  The first milk (colostrum) helps the baby's digestive system function better.   There are antibodies from the mother in the milk that help the baby fight off infections.   The baby has a lower incidence of asthma, allergies, and SIDS (sudden infant death syndrome).   The nutrients in breast milk are better than formulas for the baby and helps the baby's brain grow better.   Babies who breastfeed have less gas, colic, and constipation.  For the mother  Breastfeeding helps develop a very special bond between mother and baby.   It is more convenient, always available at the correct temperature and cheaper than formula feeding.     It burns calories in the mother and helps with losing weight that was gained during pregnancy.   It makes the uterus contract back down to normal size faster and slows bleeding following delivery.   Breastfeeding mothers have a lower risk of developing breast cancer.  NURSE FREQUENTLY  A healthy, full-term baby may breastfeed as often as every hour or space his or her feedings to every 3 hours.   How often to nurse will vary from baby to baby. Watch your baby for signs of hunger, not the clock.   Nurse as often as the baby requests, or when you feel the need to reduce the fullness of your breasts.   Awaken the baby if it has been 3  to 4 hours since the last feeding.   Frequent feeding will help the mother make more milk and will prevent problems like sore nipples and engorgement of the breasts.  BABY'S POSITION AT THE BREAST  Whether lying down or sitting, be sure that the baby's tummy is facing your tummy.   Support the breast with 4 fingers underneath the breast and the thumb above. Make sure your fingers are well away from the nipple and baby's mouth.   Stroke the baby's lips and cheek closest to the breast gently with your finger or nipple.   When the baby's mouth is open wide enough, place all of your nipple and as much of the dark area around the nipple as possible into your baby's mouth.   Pull the baby in close so the tip of the nose and the baby's cheeks touch the breast during the feeding.  FEEDINGS  The length of each feeding varies from baby to baby and from feeding to feeding.   The baby must suck about 2 to 3 minutes for your milk to get to him or her. This is called a "let down." For this reason, allow the baby to feed on each breast as long as he or she wants. Your baby will end the feeding when he or she has received the right balance of nutrients.   To break the suction, put your finger into the corner of the baby's mouth and slide it between his or her gums before removing your breast from his or her mouth. This will help prevent sore nipples.  REDUCING BREAST ENGORGEMENT  In the first week after your baby is born, you may experience signs of breast engorgement. When breasts are engorged, they feel heavy, warm, full, and may be tender to the touch. You can reduce engorgement if you:   Nurse frequently, every 2 to 3 hours. Mothers who breastfeed early and often have fewer problems with engorgement.   Place light ice packs on your breasts between feedings. This reduces swelling. Wrap the ice packs in a lightweight towel to protect your skin.   Apply moist hot packs to your breast for 5 to 10  minutes before each feeding. This increases circulation and helps the milk flow.   Gently massage your breast before and during the feeding.   Make sure that the baby empties at least one breast at every feeding before switching sides.   Use a breast pump to empty the breasts if your baby is sleepy or not nursing well. You may also want to pump if you are returning to work or or you feel you are getting engorged.   Avoid bottle feeds, pacifiers or supplemental feedings of water or juice in place of breastfeeding.   Be sure   the baby is latched on and positioned properly while breastfeeding.   Prevent fatigue, stress, and anemia.   Wear a supportive bra, avoiding underwire styles.   Eat a balanced diet with enough fluids.  If you follow these suggestions, your engorgement should improve in 24 to 48 hours. If you are still experiencing difficulty, call your lactation consultant or caregiver. IS MY BABY GETTING ENOUGH MILK? Sometimes, mothers worry about whether their babies are getting enough milk. You can be assured that your baby is getting enough milk if:  The baby is actively sucking and you hear swallowing.   The baby nurses at least 8 to 12 times in a 24 hour time period. Nurse your baby until he or she unlatches or falls asleep at the first breast (at least 10 to 20 minutes), then offer the second side.   The baby is wetting 5 to 6 disposable diapers (6 to 8 cloth diapers) in a 24 hour period by 5 to 6 days of age.   The baby is having at least 2 to 3 stools every 24 hours for the first few months. Breast milk is all the food your baby needs. It is not necessary for your baby to have water or formula. In fact, to help your breasts make more milk, it is best not to give your baby supplemental feedings during the early weeks.   The stool should be soft and yellow.   The baby should gain 4 to 7 ounces per week after he is 4 days old.  TAKE CARE OF YOURSELF Take care of your breasts  by:  Bathing or showering daily.   Avoiding the use of soaps on your nipples.   Start feedings on your left breast at one feeding and on your right breast at the next feeding.   You will notice an increase in your milk supply 2 to 5 days after delivery. You may feel some discomfort from engorgement, which makes your breasts very firm and often tender. Engorgement "peaks" out within 24 to 48 hours. In the meantime, apply warm moist towels to your breasts for 5 to 10 minutes before feeding. Gentle massage and expression of some milk before feeding will soften your breasts, making it easier for your baby to latch on. Wear a well fitting nursing bra and air dry your nipples for 10 to 15 minutes after each feeding.   Only use cotton bra pads.   Only use pure lanolin on your nipples after nursing. You do not need to wash it off before nursing.  Take care of yourself by:   Eating well-balanced meals and nutritious snacks.   Drinking milk, fruit juice, and water to satisfy your thirst (about 8 glasses a day).   Getting plenty of rest.   Increasing calcium in your diet (1200 mg a day).   Avoiding foods that you notice affect the baby in a bad way.  SEEK MEDICAL CARE IF:   You have any questions or difficulty with breastfeeding.   You need help.   You have a hard, red, sore area on your breast, accompanied by a fever of 100.5 F (38.1 C) or more.   Your baby is too sleepy to eat well or is having trouble sleeping.   Your baby is wetting less than 6 diapers per day, by 5 days of age.   Your baby's skin or white part of his or her eyes is more yellow than it was in the hospital.   You feel   depressed.  Document Released: 04/18/2005 Document Revised: 12/29/2010 Document Reviewed: 12/01/2008 [employer] Patient Information 2012 [employer]. 

## 2011-05-16 NOTE — Progress Notes (Signed)
Doing well--cultures today-u/s today shows nml fluid--no NST

## 2011-05-17 LAB — GC/CHLAMYDIA PROBE AMP, GENITAL: Chlamydia, DNA Probe: NEGATIVE

## 2011-05-18 ENCOUNTER — Encounter: Payer: Self-pay | Admitting: Family Medicine

## 2011-05-18 DIAGNOSIS — O99820 Streptococcus B carrier state complicating pregnancy: Secondary | ICD-10-CM | POA: Insufficient documentation

## 2011-05-23 ENCOUNTER — Ambulatory Visit (INDEPENDENT_AMBULATORY_CARE_PROVIDER_SITE_OTHER): Payer: BC Managed Care – PPO | Admitting: Family Medicine

## 2011-05-23 DIAGNOSIS — Z3480 Encounter for supervision of other normal pregnancy, unspecified trimester: Secondary | ICD-10-CM

## 2011-05-23 NOTE — Patient Instructions (Addendum)
Normal Labor and Delivery Your caregiver must first be sure you are in labor. Signs of labor include:  You may pass what is called "the mucus plug" before labor begins. This is a small amount of blood stained mucus.   Regular uterine contractions.   The time between contractions get closer together.   The discomfort and pain gradually gets more intense.   Pains are mostly located in the back.   Pains get worse when walking.   The cervix (the opening of the uterus becomes thinner (begins to efface) and opens up (dilates).  Once you are in labor and admitted into the hospital or care center, your caregiver will do the following:  A complete physical examination.   Check your vital signs (blood pressure, pulse, temperature and the fetal heart rate).   Do a vaginal examination (using a sterile glove and lubricant) to determine:   The position (presentation) of the baby (head [vertex] or buttock first).   The level (station) of the baby's head in the birth canal.   The effacement and dilatation of the cervix.   You may have your pubic hair shaved and be given an enema depending on your caregiver and the circumstance.   An electronic monitor is usually placed on your abdomen. The monitor follows the length and intensity of the contractions, as well as the baby's heart rate.   Usually, your caregiver will insert an IV in your arm with a bottle of sugar water. This is done as a precaution so that medications can be given to you quickly during labor or delivery.  NORMAL LABOR AND DELIVERY IS DIVIDED UP INTO 3 STAGES: First Stage This is when regular contractions begin and the cervix begins to efface and dilate. This stage can last from 3 to 15 hours. The end of the first stage is when the cervix is 100% effaced and 10 centimeters dilated. Pain medications may be given by   Injection (morphine, demerol, etc.)   Regional anesthesia (spinal, caudal or epidural, anesthetics given in  different locations of the spine). Paracervical pain medication may be given, which is an injection of and anesthetic on each side of the cervix.  A pregnant woman may request to have "Natural Childbirth" which is not to have any medications or anesthesia during her labor and delivery. Second Stage This is when the baby comes down through the birth canal (vagina) and is born. This can take 1 to 4 hours. As the baby's head comes down through the birth canal, you may feel like you are going to have a bowel movement. You will get the urge to bear down and push until the baby is delivered. As the baby's head is being delivered, the caregiver will decide if an episiotomy (a cut in the perineum and vagina area) is needed to prevent tearing of the tissue in this area. The episiotomy is sewn up after the delivery of the baby and placenta. Sometimes a mask with nitrous oxide is given for the mother to breath during the delivery of the baby to help if there is too much pain. The end of Stage 2 is when the baby is fully delivered. Then when the umbilical cord stops pulsating it is clamped and cut. Third Stage The third stage begins after the baby is completely delivered and ends after the placenta (afterbirth) is delivered. This usually takes 5 to 30 minutes. After the placenta is delivered, a medication is given either by intravenous or injection to help contract   the uterus and prevent bleeding. The third stage is not painful and pain medication is usually not necessary. If an episiotomy was done, it is repaired at this time. After the delivery, the mother is watched and monitored closely for 1 to 2 hours to make sure there is no postpartum bleeding (hemorrhage). If there is a lot of bleeding, medication is given to contract the uterus and stop the bleeding. Document Released: 01/26/2008 Document Revised: 12/29/2010 Document Reviewed: 01/26/2008 [employer] Patient Information 2012 [employer]. Breastfeeding BENEFITS OF BREASTFEEDING For the baby  The first milk (colostrum) helps the baby's digestive system function better.   There are antibodies from the mother in the milk that help the baby fight off infections.   The baby has a lower incidence of asthma, allergies, and SIDS (sudden infant death syndrome).   The nutrients in breast milk are better than formulas for the baby and helps the baby's brain grow better.   Babies who breastfeed have less gas, colic, and constipation.  For the mother  Breastfeeding helps develop a very special bond between mother and baby.   It is more convenient, always available at the correct temperature and cheaper than formula feeding.   It burns calories in the mother and helps with losing weight that was gained during pregnancy.   It makes the uterus contract back down to normal size faster and slows bleeding following delivery.   Breastfeeding mothers have a lower risk of developing breast cancer.  NURSE FREQUENTLY  A healthy, full-term baby may breastfeed as often as every hour or space his or her feedings to every 3 hours.   How often to nurse will vary from baby to baby. Watch your baby for signs of hunger, not the clock.   Nurse as often as the baby requests, or when you feel the need to reduce the fullness of your breasts.   Awaken the baby if it has been 3 to 4 hours since the last feeding.   Frequent feeding will help the mother make more milk and will prevent problems like sore nipples and engorgement of the breasts.  BABY'S POSITION AT THE BREAST  Whether lying down or sitting, be sure that the baby's tummy is facing your tummy.   Support the breast with 4 fingers underneath the breast and the thumb above. Make sure your fingers are well away from the nipple and baby's mouth.   Stroke the baby's lips and cheek closest to the breast gently with your finger or nipple.   When the baby's mouth is open wide enough,  place all of your nipple and as much of the dark area around the nipple as possible into your baby's mouth.   Pull the baby in close so the tip of the nose and the baby's cheeks touch the breast during the feeding.  FEEDINGS  The length of each feeding varies from baby to baby and from feeding to feeding.   The baby must suck about 2 to 3 minutes for your milk to get to him or her. This is called a "let down." For this reason, allow the baby to feed on each breast as long as he or she wants. Your baby will end the feeding when he or she has received the right balance of nutrients.   To break the suction, put your finger into the corner of the baby's mouth and slide it between his or her gums before removing your breast from his or her mouth. This will   help prevent sore nipples.  REDUCING BREAST ENGORGEMENT  In the first week after your baby is born, you may experience signs of breast engorgement. When breasts are engorged, they feel heavy, warm, full, and may be tender to the touch. You can reduce engorgement if you:   Nurse frequently, every 2 to 3 hours. Mothers who breastfeed early and often have fewer problems with engorgement.   Place light ice packs on your breasts between feedings. This reduces swelling. Wrap the ice packs in a lightweight towel to protect your skin.   Apply moist hot packs to your breast for 5 to 10 minutes before each feeding. This increases circulation and helps the milk flow.   Gently massage your breast before and during the feeding.   Make sure that the baby empties at least one breast at every feeding before switching sides.   Use a breast pump to empty the breasts if your baby is sleepy or not nursing well. You may also want to pump if you are returning to work or or you feel you are getting engorged.   Avoid bottle feeds, pacifiers or supplemental feedings of water or juice in place of breastfeeding.   Be sure the baby is latched on and positioned properly  while breastfeeding.   Prevent fatigue, stress, and anemia.   Wear a supportive bra, avoiding underwire styles.   Eat a balanced diet with enough fluids.  If you follow these suggestions, your engorgement should improve in 24 to 48 hours. If you are still experiencing difficulty, call your lactation consultant or caregiver. IS MY BABY GETTING ENOUGH MILK? Sometimes, mothers worry about whether their babies are getting enough milk. You can be assured that your baby is getting enough milk if:  The baby is actively sucking and you hear swallowing.   The baby nurses at least 8 to 12 times in a 24 hour time period. Nurse your baby until he or she unlatches or falls asleep at the first breast (at least 10 to 20 minutes), then offer the second side.   The baby is wetting 5 to 6 disposable diapers (6 to 8 cloth diapers) in a 24 hour period by 5 to 6 days of age.   The baby is having at least 2 to 3 stools every 24 hours for the first few months. Breast milk is all the food your baby needs. It is not necessary for your baby to have water or formula. In fact, to help your breasts make more milk, it is best not to give your baby supplemental feedings during the early weeks.   The stool should be soft and yellow.   The baby should gain 4 to 7 ounces per week after he is 4 days old.  TAKE CARE OF YOURSELF Take care of your breasts by:  Bathing or showering daily.   Avoiding the use of soaps on your nipples.   Start feedings on your left breast at one feeding and on your right breast at the next feeding.   You will notice an increase in your milk supply 2 to 5 days after delivery. You may feel some discomfort from engorgement, which makes your breasts very firm and often tender. Engorgement "peaks" out within 24 to 48 hours. In the meantime, apply warm moist towels to your breasts for 5 to 10 minutes before feeding. Gentle massage and expression of some milk before feeding will soften your breasts,  making it easier for your baby to latch on. Wear a   well fitting nursing bra and air dry your nipples for 10 to 15 minutes after each feeding.   Only use cotton bra pads.   Only use pure lanolin on your nipples after nursing. You do not need to wash it off before nursing.  Take care of yourself by:   Eating well-balanced meals and nutritious snacks.   Drinking milk, fruit juice, and water to satisfy your thirst (about 8 glasses a day).   Getting plenty of rest.   Increasing calcium in your diet (1200 mg a day).   Avoiding foods that you notice affect the baby in a bad way.  SEEK MEDICAL CARE IF:   You have any questions or difficulty with breastfeeding.   You need help.   You have a hard, red, sore area on your breast, accompanied by a fever of 100.5 F (38.1 C) or more.   Your baby is too sleepy to eat well or is having trouble sleeping.   Your baby is wetting less than 6 diapers per day, by 5 days of age.   Your baby's skin or white part of his or her eyes is more yellow than it was in the hospital.   You feel depressed.  Document Released: 04/18/2005 Document Revised: 12/29/2010 Document Reviewed: 12/01/2008 [employer] Patient Information 2012 [employer]. 

## 2011-05-23 NOTE — Progress Notes (Signed)
Doing well 

## 2011-05-23 NOTE — Progress Notes (Signed)
Patient is here for routine prenatal visit.  She did lose her mucous plug Friday.

## 2011-05-31 ENCOUNTER — Ambulatory Visit (INDEPENDENT_AMBULATORY_CARE_PROVIDER_SITE_OTHER): Payer: BC Managed Care – PPO | Admitting: Obstetrics & Gynecology

## 2011-05-31 DIAGNOSIS — O09899 Supervision of other high risk pregnancies, unspecified trimester: Secondary | ICD-10-CM

## 2011-05-31 DIAGNOSIS — O09529 Supervision of elderly multigravida, unspecified trimester: Secondary | ICD-10-CM

## 2011-05-31 DIAGNOSIS — Z22330 Carrier of Group B streptococcus: Secondary | ICD-10-CM

## 2011-05-31 DIAGNOSIS — O99820 Streptococcus B carrier state complicating pregnancy: Secondary | ICD-10-CM

## 2011-05-31 NOTE — Progress Notes (Signed)
Here for routine prenatal check.  Doing well.

## 2011-05-31 NOTE — Progress Notes (Signed)
No complaints or concerns.  Fetal movement and labor precautions reviewed. Check cervix next visit.

## 2011-05-31 NOTE — Patient Instructions (Signed)

## 2011-06-07 ENCOUNTER — Ambulatory Visit (INDEPENDENT_AMBULATORY_CARE_PROVIDER_SITE_OTHER): Payer: BC Managed Care – PPO | Admitting: Family Medicine

## 2011-06-07 DIAGNOSIS — Z3480 Encounter for supervision of other normal pregnancy, unspecified trimester: Secondary | ICD-10-CM

## 2011-06-07 NOTE — Progress Notes (Signed)
Offered membrane stripping-pt. declines

## 2011-06-07 NOTE — Patient Instructions (Signed)
Breastfeeding BENEFITS OF BREASTFEEDING For the baby  The first milk (colostrum) helps the baby's digestive system function better.   There are antibodies from the mother in the milk that help the baby fight off infections.   The baby has a lower incidence of asthma, allergies, and SIDS (sudden infant death syndrome).   The nutrients in breast milk are better than formulas for the baby and helps the baby's brain grow better.   Babies who breastfeed have less gas, colic, and constipation.  For the mother  Breastfeeding helps develop a very special bond between mother and baby.   It is more convenient, always available at the correct temperature and cheaper than formula feeding.   It burns calories in the mother and helps with losing weight that was gained during pregnancy.   It makes the uterus contract back down to normal size faster and slows bleeding following delivery.   Breastfeeding mothers have a lower risk of developing breast cancer.  NURSE FREQUENTLY  A healthy, full-term baby may breastfeed as often as every hour or space his or her feedings to every 3 hours.   How often to nurse will vary from baby to baby. Watch your baby for signs of hunger, not the clock.   Nurse as often as the baby requests, or when you feel the need to reduce the fullness of your breasts.   Awaken the baby if it has been 3 to 4 hours since the last feeding.   Frequent feeding will help the mother make more milk and will prevent problems like sore nipples and engorgement of the breasts.  BABY'S POSITION AT THE BREAST  Whether lying down or sitting, be sure that the baby's tummy is facing your tummy.   Support the breast with 4 fingers underneath the breast and the thumb above. Make sure your fingers are well away from the nipple and baby's mouth.   Stroke the baby's lips and cheek closest to the breast gently with your finger or nipple.   When the baby's mouth is open wide enough, place  all of your nipple and as much of the dark area around the nipple as possible into your baby's mouth.   Pull the baby in close so the tip of the nose and the baby's cheeks touch the breast during the feeding.  FEEDINGS  The length of each feeding varies from baby to baby and from feeding to feeding.   The baby must suck about 2 to 3 minutes for your milk to get to him or her. This is called a "let down." For this reason, allow the baby to feed on each breast as long as he or she wants. Your baby will end the feeding when he or she has received the right balance of nutrients.   To break the suction, put your finger into the corner of the baby's mouth and slide it between his or her gums before removing your breast from his or her mouth. This will help prevent sore nipples.  REDUCING BREAST ENGORGEMENT  In the first week after your baby is born, you may experience signs of breast engorgement. When breasts are engorged, they feel heavy, warm, full, and may be tender to the touch. You can reduce engorgement if you:   Nurse frequently, every 2 to 3 hours. Mothers who breastfeed early and often have fewer problems with engorgement.   Place light ice packs on your breasts between feedings. This reduces swelling. Wrap the ice packs in a   lightweight towel to protect your skin.   Apply moist hot packs to your breast for 5 to 10 minutes before each feeding. This increases circulation and helps the milk flow.   Gently massage your breast before and during the feeding.   Make sure that the baby empties at least one breast at every feeding before switching sides.   Use a breast pump to empty the breasts if your baby is sleepy or not nursing well. You may also want to pump if you are returning to work or or you feel you are getting engorged.   Avoid bottle feeds, pacifiers or supplemental feedings of water or juice in place of breastfeeding.   Be sure the baby is latched on and positioned properly while  breastfeeding.   Prevent fatigue, stress, and anemia.   Wear a supportive bra, avoiding underwire styles.   Eat a balanced diet with enough fluids.  If you follow these suggestions, your engorgement should improve in 24 to 48 hours. If you are still experiencing difficulty, call your lactation consultant or caregiver. IS MY BABY GETTING ENOUGH MILK? Sometimes, mothers worry about whether their babies are getting enough milk. You can be assured that your baby is getting enough milk if:  The baby is actively sucking and you hear swallowing.   The baby nurses at least 8 to 12 times in a 24 hour time period. Nurse your baby until he or she unlatches or falls asleep at the first breast (at least 10 to 20 minutes), then offer the second side.   The baby is wetting 5 to 6 disposable diapers (6 to 8 cloth diapers) in a 24 hour period by 5 to 6 days of age.   The baby is having at least 2 to 3 stools every 24 hours for the first few months. Breast milk is all the food your baby needs. It is not necessary for your baby to have water or formula. In fact, to help your breasts make more milk, it is best not to give your baby supplemental feedings during the early weeks.   The stool should be soft and yellow.   The baby should gain 4 to 7 ounces per week after he is 4 days old.  TAKE CARE OF YOURSELF Take care of your breasts by:  Bathing or showering daily.   Avoiding the use of soaps on your nipples.   Start feedings on your left breast at one feeding and on your right breast at the next feeding.   You will notice an increase in your milk supply 2 to 5 days after delivery. You may feel some discomfort from engorgement, which makes your breasts very firm and often tender. Engorgement "peaks" out within 24 to 48 hours. In the meantime, apply warm moist towels to your breasts for 5 to 10 minutes before feeding. Gentle massage and expression of some milk before feeding will soften your breasts, making  it easier for your baby to latch on. Wear a well fitting nursing bra and air dry your nipples for 10 to 15 minutes after each feeding.   Only use cotton bra pads.   Only use pure lanolin on your nipples after nursing. You do not need to wash it off before nursing.  Take care of yourself by:   Eating well-balanced meals and nutritious snacks.   Drinking milk, fruit juice, and water to satisfy your thirst (about 8 glasses a day).   Getting plenty of rest.   Increasing calcium in   your diet (1200 mg a day).   Avoiding foods that you notice affect the baby in a bad way.  SEEK MEDICAL CARE IF:   You have any questions or difficulty with breastfeeding.   You need help.   You have a hard, red, sore area on your breast, accompanied by a fever of 100.5 F (38.1 C) or more.   Your baby is too sleepy to eat well or is having trouble sleeping.   Your baby is wetting less than 6 diapers per day, by 5 days of age.   Your baby's skin or white part of his or her eyes is more yellow than it was in the hospital.   You feel depressed.  Document Released: 04/18/2005 Document Revised: 12/29/2010 Document Reviewed: 12/01/2008 [employer] Patient Information 2012 [employer]. 

## 2011-06-07 NOTE — Progress Notes (Signed)
Routine prenatal check. 

## 2011-06-14 ENCOUNTER — Ambulatory Visit (INDEPENDENT_AMBULATORY_CARE_PROVIDER_SITE_OTHER): Payer: BC Managed Care – PPO | Admitting: Family Medicine

## 2011-06-14 DIAGNOSIS — Z3480 Encounter for supervision of other normal pregnancy, unspecified trimester: Secondary | ICD-10-CM

## 2011-06-14 NOTE — Progress Notes (Signed)
Routine prenatal check.  Patient is having an increase in tightening but otherwise no symptoms.

## 2011-06-14 NOTE — Progress Notes (Signed)
Membranes stripped  

## 2011-06-14 NOTE — Progress Notes (Signed)
Addended by: Reva Bores on: 06/14/2011 06:19 PM   Modules accepted: Kipp Brood

## 2011-06-14 NOTE — Patient Instructions (Signed)
Depresin postparto (Postpartum Depression) Luego del parto su organismo va a sufrir modificaciones drsticas en los niveles de hormonas. Puede sentir deseos de llorar sin motivo aparente y verse incapaz de enfrentarse a todos los cambios que trae un nuevo beb. Esta es una reaccin comn luego del [place]. Busque apoyo en su pareja o en sus amigos y permtase un tiempo para recuperarse. Si estos sentimientos persisten luego de varias semanas, contctese con su mdico u otros profesionales que puedan ofrecerle ayuda. QU ES LA DEPRESIN? La depresin puede describirse como un sentimiento de tristeza, dolor, infelicidad o hundimiento. Casi todos nos hemos sentido de este modo en algunos momentos durante breves perodos. Pero la verdadera depresin es un trastorno del estado de nimo con sentimientos de [place], prdida, [place], miedo o frustracin que interfieren en la vida diaria por un tiempo prolongado. La depresin puede ser leve, moderada o intensa. El [place] de depresin, que ser evaluado por un profesional, Physicist, medical. La depresin posparto se produce en los das o meses siguientes al nacimiento del beb. ES FRECUENTE LA DEPRESIN DURANTE Y DESPUS EL EMBARAZO? La depresin que se produce durante el embarazo o dentro del ao posterior al parto se denomina depresin perinatal. La depresin despus del embarazo de denomina depresin postparto o periparto. El nmero exacto de mujeres que sufren depresin en esta etapa no se conoce, pero puede ocurrir en aproximadamente en el 10% al 15% de las mujeres. Los investigadores consideran que la depresin es una de las complicaciones ms frecuentes durante y despus del Psychiatrist. Muchas veces la depresin no se reconoce ni se trata porque algunos de los cambios normales del embarazo causan sntomas similares y ocurren al mismo tiempo. Durante y despus del embarazo puede haber cansancio, problemas para dormir, reacciones emocionales intensas  y Engineer, maintenance (IT). Pero estos sntomas tambin son signos de depresin.  CAUSAS  Cambios hormonales rpidos. Los estrgenos y la progesterona disminuyen rpidamente luego del [place]. Los investigadores creen que las rpidas modificaciones en los niveles de hormonas pueden causar depresin igual que los pequeos cambios que afectan el estado de nimo de las mujeres cuando tienen su perodo menstrual.   La disminucin de la hormona tiroidea. La hormona tiroidea regula el modo en que el organismo [place] y Environmental manager la energa proveniente de los alimentos (metabolismo). Un simple anlisis de Sara Lee puede diagnosticar si este problema causa depresin en la mujer En este caso, el mdico le indicar medicamentos para regular la funcin tiroidea.   Un acontecimiento estresante la vida, como la muerte de Media planner. Puede causar cambios qumicos en el cerebro que producen depresin.   Sentirse abrumada por la tarea de cuidar a un recin nacido.   La depresin tambin es una enfermedad que se manifiesta dentro de [place]. No siempre son claras las causas de la depresin.  LOS SIGUIENTES FACTORES PUEDEN AUMENTAR LAS PROBABILIDADES DE SUFRIR DEPRESIN DURANTE EL EMBARAZO:  Historia de depresin.   Consumo de drogas o alcohol.   Poco apoyo por parte de familiares y amigos.   Problemas con embarazos o partos anteriores.   Maternidad precoz.   Vivir sola.   Escaso apoyo social.   Antecedentes familiares de Oncologist.   Ansiedad relacionada con el beb.   Problemas conyugales o econmicos.   Depresin postparto en un embarazo anterior.   Sufrir un trastorno psiquitrico (esquizofrenia, trastorno bipolar).   Atravesar un embarazo difcil o estresante.   Trabajo de parto complicado.   Mudanza a otra ciudad o [place]  durante embarazo o poco despus del parto  OTROS FACTORES QUE CONTRIBUYEN A LA DEPRESIN POSPARTO SON:   La sensacin de cansancio despus del  parto, la modificacin de los patrones del sueo, y la falta de descanso impiden que la mam reciente recupere su fuerza durante varias semanas.   Sentirse abrumada con el nuevo beb al que debe cuidar y dudar de su capacidad de ser [employer].   Sufrir estrs por los Starwood Hotels rutinas laborales y Therapist, occupational. Algunas veces las mujeres creen que tienen que ser "super mams" o perfectas. Esto no es Health visitor y Health and safety inspector ms estrs.   Tener sensacin de prdida: prdida de identidad, prdida de control, prdida de la figura que tena antes del Psychiatrist y la sensacin de ser menos atractiva.   Tener AES Corporation y menos control del [place]. Tener que permanecer dentro de la casa durante largos periodos y Warehouse manager menos tiempo para su pareja y sus seres queridos.   World Fuel Services Corporation en las actividades diarias en el hogar o en el [place].   Temor de no saber cmo cuidar al beb correctamente o de daarlo.   Sentimiento de culpa por no cuidar adecuadamente del beb.  SNTOMAS Cualquiera de estos sntomas durante y despus del embarazo que duren ms de [address] son signo de depresin   Sensacin de inquietud o irritabilidad.   Sensacin de tristeza, desesperanza y abrumacin.   Llorar mucho.   No tener energa o motivacin.   Comer poco o demasiado.   Dificultad para dormir o dormir demasiado.   Problemas para centrar a atencin, tomar decisiones, recordar.   Sentirse desvalorizada y culpable.   Prdida de inters o Schering[employer]ough.   Alejamiento de los amigos habituales y de Market researcher.   Dolor de cabeza, Journalist, newspaper, latidos cardiacos rpidos o irregulares (palpitaciones) o respiracin rpida y superficial (hiperventilacin).   Luego del Psychiatrist, sentir temor a daar al beb o de daarse a s misma y no Oncologist.   No ser capaz que atenderse a si misma o al beb.   Prdida de inters en el cuidado del beb.   Ansiedad y crisis  de [place].   Pensamiento de lastimarse a si misma, al beb o a Engineer, maintenance (IT).   Sentimiento de culpa por no cuidar adecuadamente del beb.  CUL ES LA DIFERENCIA ENTRE UNA DEPRESIN POSTPARTO LEVE Y UNA DEPRESIN POSTPARTO PSICTICA  El "baby blues" o tristeza puerperal ocurre en el 70 a 80% de las veces y Merchant navy officer ocurrir en los primeros das despus del [place]. Normalmente desaparece en [employer]well. La mam reciente puede tener cambios sbitos en el estado de nimo, tristeza, ataques de llanto, trastornos del sueo y sentirse irritable, cansada, ansiosa y sola. Los sntomas no son graves y no es Runner, broadcasting/film/video. Para sentirse mejor usted puede: Tomar una siesta cuando el nio duerme. Pedir ayuda a su esposo, familiares y Personnel officer. Unirse a un grupo de apoyo para conversar con [employer]. Si el "baby blues" no desaparece en una semana o [address], o [place], podr sufrir depresin posparto   La depresin postparto puede ocurrir en cualquier momento dentro del primer ao despus del parto Una mujer puede tener ciertos sntomas como tristeza, falta de [place], problemas para concentrarse, ansiedad y sentimientos de culpa y desvalorizacin. La diferencia entre la depresin posparto y el "baby blues" es que el sentimiento en la depresin posparto es [place]  ms intenso y Contractor de la [place]. Le impide funcionar bien durante un perodo prolongado. La depresin postparto necesita tratamiento. Podr recibir [place] de la psicoterapia, los grupos de apoyo y los medicamentos.   La psicosis postparto es rara. Ocurre en 1 o 2 De cada 1000 nacimientos. Las mujeres que sufren trastorno bipolar, trastornos esquizoafectivos, o Brewing technologist de enfermedades psicticas tiene mayor riesgo de sufrir psicosis postparto. Lo sntomas son ilusiones, alucinaciones, trastornos del sueo y pensamientos obsesivos relacionados con el beb. Puede tener rpidos Mattel de  nimo, desde la depresin ya la irritabilidad y la euforia. Esta es una enfermedad grave y Transport planner atencin y TEFL teacher profesional.  QU DEBO HACER SI TENGO SNTOMAS DE DEPRESIN DURANTE EL EMBARAZO O DESPUS DEL PARTO?  Algunas mujeres no le comentan a nadie su sntoma porque sienten vergenza o culpa por sentirse deprimidas cuando se supone que deberan ser felices. Su preocupacin es que sean consideradas como madres no aptas. La depresin perinatal puede ocurrirle a cualquier mujer. No significa que es [address] persona mala o una madre ausente. Ni usted ni su beb deben sufrir. Podemos ayudarla. Debe comentar con su esposo, pareja, familia o su mdico lo que le ocurre.   Hay diferentes tipos de terapias indivuduales y grupales que ayudan a una mujer con depresin perinatal a sentirse mejor y funcionar mejor como mam y Chief Technology Officer. Algunos estudios indican que muchas mujeres con depresin perinatal mejoran cuando son tratadas con antidepresivos. El profesional puede ayudarla a que aprenda ms acerca de estas opciones y deca que abordaje es el mejor para usted y su bebe. La prxima seccin contiene informacin ms detallada acerca de los tratamientos disponibles.   Converse con su mdico si tiene sntomas de depresin entre mientras est embarazada o despus del parto. El profesional y Building services engineer una prueba de la depresin. Tambin podr ser derivada a un profesional de la salud mental especializado en tratar depresiones.  INSTRUCCIONES PARA EL CUIDADO DOMICILIARIO  Trate de descansar todo lo que pueda. Tome una siesta cuando el beb duerme.   No se presione para hacer todo por s misma. Haga lo que pueda y deje el resto.   Pida ayuda con las tareas domsticas y alimentacin. Pdale a su esposo o compaero que le traiga al beb para que usted pueda alimentarlo. Si puede, haga que un amigo, familiar o profesional le ayude en el hogar durante algunas horas del da.   Converse con su esposo,  compaero, familiar o amigo acerca de cmo se siente.   No pase mucho tiempo sola. Vstase y salga de la casa. Camine sin rumbo o haga caminatas cortas.   Pase tiempo sola con su esposo o compaero.   Converse con otras madres de modo que pueda. aprender de sus experiencias   Comunquese con los lugares de su localidad que le brinden informacin y servicios.   No realice cambios importantes durante el embarazo. Los grandes cambios pueden causar estrs innecesario. En algunos casos no pueden evitarse. Pida apoyo y ayuda para la nueva situacin por anticipado.   Haga ejercicios regularmente.   Consuma una dieta balanceada y nutritiva.   Busque ayuda si tiene problemas econmicos o de pareja.   Tome todos los medicamentos que el mdico le indique.   Cumpla con todos los controles del postparto.  TRATAMIENTO Hay dos tipos de tratamiento para la depresin.  Psicoterapia. Consiste en conversar con un terapeuta, psiclogo, sacerdote o trabajador social para aprender a IT consultant en  que la depresin la hace sentir, pensar y Research scientist (life sciences).   Medicamentos. El profesional y Building services engineer un antidepresivo para Public house manager. Estos medicamentos pueden aliviar sus sntomas de depresin.   Las mujeres que estn embarazadas o amamantando deben conversar con l profesional acerca de las ventajas y riesgos de tomar medicamentos antidepresivos. Some women are concerned that taking these medicines may harm the baby. A mother's depression can affect her baby's development. Getting treatment is important for both mother and baby. The risks of taking medicine have to be weighed against the risks of depression. It is a decision that women need to discuss carefully with their caregivers. Women who decide to take antidepressant medicines should talk to their caregivers about which antidepressant medicines are safer to take while pregnant or breastfeeding.  Qu efectos puede la depresin no tratada tener?  La depresin  no slo afecta a la madre sino que tambin afecta a toda la familia. Algunos investigadores han encontrado que la depresin durante el embarazo puede aumentar el riesgo de tener un beb de bajo peso o prematuro. Algunas mujeres con depresin tienen dificultad para atenderse a s Multimedia programmer. Tienen problemas de alimentacin y no ganar el peso suficiente. Tambin pueden tener problemas para dormir, Designer, multimedia, no seguir las indicaciones mdicas, consumir una dieta insuficiente utilizar sustancias nocivas como tabaco,alcohol, o drogas.   La depresin posparto puede afectar la capacidad de ser mam. Puede sentir falta de energa, tener problemas para concentrarse, estar irritable y no poder satisfacer las necesidades del nio de amor y afecto [place], puede sentirse culpable y perder confianza en s misma como madre. Todo esto empeora la depresin. Los investigadores consideran que la depresin postparto puede afectar al beb ocasionando demoras en el desarrollo del [place], problemas con los vnculos emocionales, trastornos de [place], bajo [place] de [place], trastornos del sueo y Nurse, children's. Puede ser de gran ayuda que el padre u otra persona ayude a Patent examiner las necesidades del beb y de los otros nios de la familia mientras la mam est deprimida.   Todos los nios merecen la posibilidad de tener una mam sana. Todas las mams merecen la posibilidad de disfrutar su vida y sus hijos. No sufra sola. Si tiene sntomas de depresin Academic librarian o despus de tener al beb, comntelo con un ser querido y llame a su mdico enseguida.  SOLICITE ATENCIN MDICA SI:  Piensa que sufre depresin posparto.   Necesita medicamentos para tratar su depresin.   Beverlee Nims ser derivada a un psiquiatra o psiclogo.   Tiene una reaccin o problemas con su medicamento.  SOLICITE ATENCIN MDICA DE INMEDIATO SI:  Tiene ideas suicidas.   Siente que puede daar al  beb.   Siente que puede daar a su compaero o pareja o a Engineer, maintenance (IT).   Siente que necesita ser McDonald's Corporation en un hospital.   Siente que pierde el control y necesita tratamiento inmediatamente.  PARA MS INFORMACIN [facility]): http://hoffman.com/ General Mills of Mental Health, NIH, HHS: SisterViews.hu American Psychological https://hill-garner.info/ Educacin postparto para padres: MaxBlogs.de Navistar International Corporation, SAMHSA, HHS:www.mentalhealth.org National Mental Health ApartmentMom.com.ee Apoyo postparto internacional:www.postpartum.net Document Released: 10/05/2007 Document Revised: 12/29/2010 [facility] Patient Information 2012 [place], [place]. Amamantar al beb (Breastfeeding) LOS BENEFICIOS DE AMAMANTAR Para el beb  La primera leche (calostro ) ayuda al mejor funcionamiento del sistema digestivo del beb.   La leche tiene anticuerpos que provienen de la madre y que ayudan a prevenir las infecciones en el beb.  Hay una menor incidencia de asma, enfermedades alrgicas y SMSI (sndrome de muerte sbita nfantil).   Los nutrientes que contiene la [place] materna son mejores que las frmulas para el bibern y favorecen el desarrollo cerebral.   Los bebs amamantados sufren menos gases, clicos y constipacin.  Para la mam  La lactancia materna favorece el desarrollo de un vnculo muy especial entre la madre y el beb.   Es ms conveniente, siempre disponible a la Optician, dispensing y ms econmica que la [employer]S Inc.   Consume caloras en la madre y la ayuda a perder el peso ganado durante el [place].   Favorece la contraccin del tero a su tamao normal, de manera ms rpida y Ber[employer] H[employer]away las hemorragias luego del [place].   Las M.D.C. Holdings que amamantan tienen menor riesgo de Geophysical data processor de mama.  AMAMNTELO CON FRECUENCIA  Un beb sano, nacido a trmino, puede amamantarse con tanta  frecuencia como cada hora, o espaciar las comidas cada tres horas.   Esta frecuencia variar de un beb a otro. Observe al beb cuando manifieste signos de hambre, antes que regirse por el reloj.   Amamntelo tan seguido como el beb lo solicite, o cuando usted sienta la necesidad de Paramedic sus [place].   Despierte al beb si han pasado 3  4 horas desde la ltima comida.   El amamantamiento frecuente la ayudar a producir ms [place] y a Education officer, community de Engineer, mining en los pezones e hinchazn de las [place].  LA POSICIN DEL BEB PARA AMAMANTARLO  Ya sea que se encuentre acostada o sentada, asegrese que el abdomen del beb enfrente el suyo.   Sostenga la mama con el pulgar por arriba y el resto de los dedos por debajo. Asegrese que sus dedos se encuentren lejos del pezn y de la boca del beb.   Toque suavemente los labios del beb y la mejilla ms cercana a la mama con el dedo o el pezn.   Cuando la boca del beb se abra lo suficiente, introduzca el pezn y la zona oscura que lo rodea tanto como le sea posible dentro de la boca.   Coloque a beb cerca suyo de modo que su nariz y mejillas toquen las mamas al Texas Instruments.  LAS COMIDAS  La duracin de cada comida vara de un beb a otro y de [place] comida a [place].   El beb debe succionar alrededor American Financial o tres minutos para que le llegue [place]. Esto se denomina "bajada". Por este motivo, permita que el nio se alimente en cada mama todo lo que desee. Terminar de mamar cuando haya recibido la cantidad [place] de nutrientes.   Para detener la succin coloque su dedo en la comisura de la boca del nio y Midwife entre sus encas antes de quitarle la mama de la boca. Esto la ayudar a English as a second language teacher.  REDUCIR LA CONGESTIN DE LAS MAMAS  Durante la primera semana despus del parto, usted puede experimentar Monsanto Company. Cuando las mamas estn congestionadas, se sienten calientes, llenas y molestas al tacto. Puede reducir la  congestin si:   Lo amamanta frecuentemente, cada 2-3 horas. Las mams que CDW Corporation pronto y con frecuencia tienen menos problemas de [place].   Coloque bolsas fras livianas entre cada [place]. Esto ayuda a Building services engineer. Envuelva las bolsas de hielo en una toalla liviana para proteger su piel.   Aplique compresas hmedas calientes Wm. Wrigley Jr. Company durante 5 a 10 minutos antes de amamantar al  nio. Esto aumenta la circulacin y [place] a que la [place].   Masajee suavemente la mama antes y Psychologist, sport and exercise.   Asegrese que el nio vaca al menos una mama antes de cambiar de lado.   Use un sacaleche para vaciar la mama si el beb se duerme o no se alimenta bien. Tambin podr Phelps Dodge con esta bomba si tiene que volver al trabajo o siente que las mamas estn congestionadas.   Evite los biberones, chupetes o complementar la alimentacin con agua o jugos en lugar de la [place].   Verifique que el beb se encuentra en la posicin correcta mientras lo alimenta.   Evite el cansancio, el estrs y la anemia   Use un soutien que sostenga bien sus mamas y evite los que tienen aro.   Consuma una dieta balanceada y beba lquidos en cantidad.  Si sigue estas indicaciones, la congestin debe mejorar en 24 a 48 horas. Si an tiene dificultades, consulte a Barista. TENDR SUFICIENTE LECHE MI BEB? Algunas veces las madres se preocupan acerca de si sus bebs tendrn la leche suficiente. Puede asegurarse que el beb tiene la leche suficiente si:  El beb succiona y escucha que traga activamente.   El nio se alimenta al menos 8 a 12 veces en 24 horas. Alimntelo hasta que se desprenda por sus propios medios o se quede dormido en la primera mama (al menos durante 10 a 20 minutos), luego ofrzcale el otro lado.   El beb moja 5 a 6 paales descartables (6 a 8 paales de tela) en 24 horas cuando tiene 5  6 das de vida.   Tiene al menos 2-3 deposiciones  todos los Becton, Dickinson and Company primeros meses. La leche materna es todo el alimento que el beb necesita. No es necesario que el nio ingiera agua o preparados de bibern. De hecho, para ayudar a que sus mamas produzcan ms [place], lo mejor es no darle al beb suplementos durante las primeras semanas.   La materia fecal debe ser blanda y [place].   El beb debe aumentar 112 a 196 g por semana.  CUDESE Cuide sus mamas del siguiente modo:  Bese o dchese diariamente.   No lave sus pezones con jabn.   Comience a amamantar del lado izquierdo en una comida y del lado derecho en la siguiente.   Notar que H[employer]R Block suministro de [place] a los 2 a 5 [address]  [address] despus del [place]. Puede sentir algunas molestias por la congestin, lo que hace que sus mamas estn duras y sensibles. La congestin disminuye en 24 a 48 horas. Mientras tanto, aplique toallas hmedas calientes durante 5 a 10 minutos antes de amamantar. Un masaje suave y la extraccin de un poco de leche antes de Museum/gallery exhibitions officer ablandarn las mamas y har ms fcil que el beb se agarre. Use un buen sostn y seque al aire los pezones durante 10 a 15 minutos luego de cada alimentacin.   Solo utilice apsitos de algodn.   Utilice lanolina WESCO International pezones luego de [place]. No necesita lavarlos luego de alimentar al [employer]raw[employer]Hill.  Cudese del siguiente modo:   Consuma alimentos bien balanceados y refrigerios nutritivos.   Dixie Dials, jugos de fruta y agua para Warehouse manager sed (alrededor de 8 vasos por Futures trader).   Descanse lo suficiente.   Aumente la ingesta de calcio en la dieta (1200mg /da).   Evite los alimentos que usted nota que puedan afectar al beb.  SOLICITE ATENCIN  MDICA SI:  Tiene preguntas que formular o dificultades con la alimentacin a pecho.   Necesita ayuda.   Observa una zona dura, roja y que le duele en la zona de la mama, y se acompaa de fiebre de 100.5 F (38.1 C) o ms.   El beb est muy somnoliento como para  alimentarse bien o tiene problemas para dormir.   El beb moja menos de 6 paales por da, a partir de los [address] de [place].   La piel del beb o la parte blanca de sus ojos est ms amarilla de lo que estaba en el hospital.   Se siente deprimida.  Document Released: 04/18/2005 Document Revised: 12/29/2010 [facility] Patient Information 2012 [place], [place].

## 2011-06-15 ENCOUNTER — Inpatient Hospital Stay (HOSPITAL_COMMUNITY)
Admission: AD | Admit: 2011-06-15 | Discharge: 2011-06-18 | DRG: 373 | Disposition: A | Discharge status: Home / Self Care | Payer: BC Managed Care – PPO | Source: Ambulatory Visit | Attending: Obstetrics & Gynecology | Admitting: Obstetrics & Gynecology

## 2011-06-15 ENCOUNTER — Telehealth (HOSPITAL_COMMUNITY): Payer: Self-pay | Admitting: *Deleted

## 2011-06-15 ENCOUNTER — Inpatient Hospital Stay (HOSPITAL_COMMUNITY): Payer: BC Managed Care – PPO | Admitting: Anesthesiology

## 2011-06-15 ENCOUNTER — Encounter (HOSPITAL_COMMUNITY): Payer: Self-pay | Admitting: *Deleted

## 2011-06-15 ENCOUNTER — Encounter (HOSPITAL_COMMUNITY): Payer: Self-pay | Admitting: Anesthesiology

## 2011-06-15 DIAGNOSIS — O99820 Streptococcus B carrier state complicating pregnancy: Secondary | ICD-10-CM

## 2011-06-15 DIAGNOSIS — IMO0001 Reserved for inherently not codable concepts without codable children: Secondary | ICD-10-CM

## 2011-06-15 DIAGNOSIS — O09529 Supervision of elderly multigravida, unspecified trimester: Secondary | ICD-10-CM | POA: Diagnosis present

## 2011-06-15 DIAGNOSIS — O99892 Other specified diseases and conditions complicating childbirth: Secondary | ICD-10-CM | POA: Diagnosis present

## 2011-06-15 DIAGNOSIS — Z22330 Carrier of Group B streptococcus: Secondary | ICD-10-CM

## 2011-06-15 LAB — CBC
MCH: 25.9 pg — ABNORMAL LOW (ref 26.0–34.0)
MCHC: 32.4 g/dL (ref 30.0–36.0)
Platelets: 160 10*3/uL (ref 150–400)
RDW: 15.5 % (ref 11.5–15.5)

## 2011-06-15 MED ORDER — ACETAMINOPHEN 325 MG PO TABS: 650.0000 mg | ORAL_TABLET | ORAL | Status: DC | PRN

## 2011-06-15 MED ORDER — PHENYLEPHRINE 40 MCG/ML (10ML) SYRINGE FOR IV PUSH (FOR BLOOD PRESSURE SUPPORT): 80.0000 ug | PREFILLED_SYRINGE | INTRAVENOUS | Status: DC | PRN

## 2011-06-15 MED ORDER — FENTANYL 2.5 MCG/ML BUPIVACAINE 1/10 % EPIDURAL INFUSION (WH - ANES)
14.0000 mL/h | INTRAMUSCULAR | Status: DC
  Administered 2011-06-15 – 2011-06-16 (×2): 14 mL/h via EPIDURAL
  Filled 2011-06-15 (×2): qty 60

## 2011-06-15 MED ORDER — ONDANSETRON HCL 4 MG/2ML IJ SOLN: 4.0000 mg | Freq: Four times a day (QID) | INTRAMUSCULAR | Status: DC | PRN

## 2011-06-15 MED ORDER — FLEET ENEMA 7-19 GM/118ML RE ENEM: 1.0000 | ENEMA | RECTAL | Status: DC | PRN

## 2011-06-15 MED ORDER — PHENYLEPHRINE 40 MCG/ML (10ML) SYRINGE FOR IV PUSH (FOR BLOOD PRESSURE SUPPORT)
80.0000 ug | PREFILLED_SYRINGE | INTRAVENOUS | Status: DC | PRN
  Filled 2011-06-15: qty 5

## 2011-06-15 MED ORDER — NALBUPHINE SYRINGE 5 MG/0.5 ML: 5.0000 mg | INJECTION | INTRAMUSCULAR | Status: DC | PRN

## 2011-06-15 MED ORDER — LIDOCAINE HCL (PF) 1 % IJ SOLN
INTRAMUSCULAR | Status: DC | PRN
  Administered 2011-06-15 (×3): 4 mL

## 2011-06-15 MED ORDER — OXYTOCIN 20 UNITS IN LACTATED RINGERS INFUSION - SIMPLE
125.0000 mL/h | Freq: Once | INTRAVENOUS | Status: AC
  Administered 2011-06-16: 125 mL/h via INTRAVENOUS

## 2011-06-15 MED ORDER — OXYTOCIN BOLUS FROM INFUSION
500.0000 mL | Freq: Once | INTRAVENOUS | Status: DC
  Filled 2011-06-15: qty 500
  Filled 2011-06-15: qty 1000

## 2011-06-15 MED ORDER — DIPHENHYDRAMINE HCL 50 MG/ML IJ SOLN: 12.5000 mg | INTRAMUSCULAR | Status: DC | PRN

## 2011-06-15 MED ORDER — LACTATED RINGERS IV SOLN: 500.0000 mL | INTRAVENOUS | Status: DC | PRN

## 2011-06-15 MED ORDER — PENICILLIN G POTASSIUM 5000000 UNITS IJ SOLR
2.5000 10*6.[IU] | INTRAVENOUS | Status: DC
  Filled 2011-06-15 (×4): qty 2.5

## 2011-06-15 MED ORDER — LACTATED RINGERS IV SOLN: INTRAVENOUS | Status: DC

## 2011-06-15 MED ORDER — OXYCODONE-ACETAMINOPHEN 5-325 MG PO TABS: 1.0000 | ORAL_TABLET | ORAL | Status: DC | PRN

## 2011-06-15 MED ORDER — CITRIC ACID-SODIUM CITRATE 334-500 MG/5ML PO SOLN: 30.0000 mL | ORAL | Status: DC | PRN

## 2011-06-15 MED ORDER — LACTATED RINGERS IV SOLN
500.0000 mL | Freq: Once | INTRAVENOUS | Status: AC
  Administered 2011-06-15: 500 mL via INTRAVENOUS

## 2011-06-15 MED ORDER — LIDOCAINE HCL (PF) 1 % IJ SOLN
30.0000 mL | INTRAMUSCULAR | Status: DC | PRN
  Filled 2011-06-15: qty 30

## 2011-06-15 MED ORDER — EPHEDRINE 5 MG/ML INJ: 10.0000 mg | INTRAVENOUS | Status: DC | PRN

## 2011-06-15 MED ORDER — EPHEDRINE 5 MG/ML INJ
10.0000 mg | INTRAVENOUS | Status: DC | PRN
  Filled 2011-06-15: qty 4

## 2011-06-15 MED ORDER — PENICILLIN G POTASSIUM 5000000 UNITS IJ SOLR
5.0000 10*6.[IU] | Freq: Once | INTRAVENOUS | Status: AC
  Administered 2011-06-15: 5 10*6.[IU] via INTRAVENOUS
  Filled 2011-06-15: qty 5

## 2011-06-15 NOTE — Anesthesia Preprocedure Evaluation (Signed)
Anesthesia Evaluation  Patient identified by MRN, date of birth, ID band Patient awake    Reviewed: Allergy & Precautions, H&P , NPO status , Patient's Chart, lab work & pertinent test results, reviewed documented beta blocker date and time   History of Anesthesia Complications Negative for: history of anesthetic complications  Airway Mallampati: I TM Distance: >3 FB Neck ROM: full    Dental  (+) Teeth Intact   Pulmonary neg pulmonary ROS,  clear to auscultation        Cardiovascular neg cardio ROS regular Normal    Neuro/Psych Negative Neurological ROS  Negative Psych ROS   GI/Hepatic Neg liver ROS, GERD-  ,H/o esophageal stricture, s/p endoscopy to release   Endo/Other  Negative Endocrine ROS  Renal/GU negative Renal ROS  Genitourinary negative   Musculoskeletal   Abdominal   Peds  Hematology negative hematology ROS (+)   Anesthesia Other Findings   Reproductive/Obstetrics (+) Pregnancy                           Anesthesia Physical Anesthesia Plan  ASA: II  Anesthesia Plan: Epidural   Post-op Pain Management:    Induction:   Airway Management Planned:   Additional Equipment:   Intra-op Plan:   Post-operative Plan:   Informed Consent: I have reviewed the patients History and Physical, chart, labs and discussed the procedure including the risks, benefits and alternatives for the proposed anesthesia with the patient or authorized representative who has indicated his/her understanding and acceptance.     Plan Discussed with:   Anesthesia Plan Comments:         Anesthesia Quick Evaluation

## 2011-06-15 NOTE — Progress Notes (Signed)
Pt may go to room 163. 

## 2011-06-15 NOTE — Progress Notes (Signed)
L. Leftwich-Kirby, CNM at bedside.  Assessment done and poc discussed with pt.  

## 2011-06-15 NOTE — Telephone Encounter (Signed)
Preadmission screen  

## 2011-06-15 NOTE — Progress Notes (Signed)
Contractions q 5 minutes tonight, scheduled for induction in am.

## 2011-06-15 NOTE — Anesthesia Procedure Notes (Signed)
Epidural Patient location during procedure: OB Start time: 06/15/2011 11:25 PM Reason for block: procedure for pain  Staffing Performed by: anesthesiologist   Preanesthetic Checklist Completed: patient identified, site marked, surgical consent, pre-op evaluation, timeout performed, IV checked, risks and benefits discussed and monitors and equipment checked  Epidural Patient position: sitting Prep: site prepped and draped and DuraPrep Patient monitoring: continuous pulse ox and blood pressure Approach: midline Injection technique: LOR air  Needle:  Needle type: Tuohy  Needle gauge: 17 G Needle length: 9 cm Needle insertion depth: 4 cm Catheter type: closed end flexible Catheter size: 19 Gauge Catheter at skin depth: 9 cm Test dose: negative  Assessment Events: blood not aspirated, injection not painful, no injection resistance, negative IV test and no paresthesia  Additional Notes Discussed risk of headache, infection, bleeding, nerve injury and failed or incomplete block.  Patient voices understanding and wishes to proceed.

## 2011-06-15 NOTE — H&P (Signed)
Mikayla Snow is a 36 y.o. female presenting for regular contractions.  Her membranes were stripped in office at [facility] yesterday morning.  She had irregular cramping all day yesterday which stopped last night.  She has had irregular, more painful contractions all day today that became regular around 8 pm and have gotten progressively stronger.  She reports good fetal movement, denies LOF, vaginal bleeding, h/a, epigastric pain, visual disturbances, dizziness, or n/v.    Pt reports her last baby weighed over 10 lbs and was born vaginally without complication, other than retained placenta which was manually removed by physician in the labor room.     Maternal Medical History:  Reason for admission: Reason for admission: contractions.  Reason for Admission:   nauseaContractions: Onset was 3-5 hours ago.   Frequency: regular.   Duration is approximately 1 minute.   Perceived severity is strong.    Fetal activity: Perceived fetal activity is normal.   Last perceived fetal movement was within the past hour.    Prenatal complications: no prenatal complications   OB History    Grav Para Term Preterm Abortions TAB SAB Ect Mult Living   2 1 1  0 0 0 0 0 0 1     Past Medical History  Diagnosis Date  . Esophageal stricture   . AMA (advanced maternal age) multigravida 35+   . GERD (gastroesophageal reflux disease)    Past Surgical History  Procedure Date  . Wisdom tooth extraction    Family History: family history includes Heart disease in her mother. Social History:  reports that she has never smoked. She has never used smokeless tobacco. She reports that she does not drink alcohol or use illicit drugs.  Review of Systems  Constitutional: Negative for fever, chills and malaise/fatigue.  Eyes: Negative for blurred vision and double vision.  Respiratory: Negative for cough and shortness of breath.   Cardiovascular: Negative.   Gastrointestinal: Negative for nausea and vomiting.    Genitourinary: Negative for dysuria, urgency and frequency.  Musculoskeletal: Negative.   Neurological: Negative for dizziness and headaches.  Psychiatric/Behavioral: Negative.     Dilation: 5 Effacement (%): 100 Station: -2 Exam by:: L. Leftwich-Kirby, CNM Blood pressure 131/86, pulse 90, temperature 97.2 F (36.2 C), resp. rate 18, height 5' 5.5" (1.664 m), weight 78.926 kg (174 lb), last menstrual period 09/06/2010. Maternal Exam:  Uterine Assessment: Contraction strength is moderate.  Contraction duration is 70 seconds. Contraction frequency is regular.   Abdomen: Patient reports no abdominal tenderness. Fundal height is 41 cm.   Fetal presentation: vertex  Introitus: Normal vagina.  Pelvis: adequate for delivery.   Cervix: Cervix evaluated by digital exam.     Fetal Exam Fetal Monitor Review: Mode: ultrasound.   Baseline rate: 130.  Variability: moderate (6-25 bpm).   Pattern: accelerations present and no decelerations.    Fetal State Assessment: Category I - tracings are normal.     Physical Exam  Constitutional: She is oriented to person, place, and time. She appears well-developed and well-nourished.  Neck: Normal range of motion.  Cardiovascular: Normal rate, regular rhythm and normal heart sounds.   Respiratory: Effort normal and breath sounds normal.  GI: Soft.  Genitourinary:       Cervix 5/100/-2, vertex, with BBOW  Musculoskeletal: Normal range of motion.  Neurological: She is alert and oriented to person, place, and time. She has normal reflexes.  Skin: Skin is warm and dry.  Psychiatric: She has a normal mood and affect. Her behavior is  normal. Judgment and thought content normal.    Prenatal labs: ABO, Rh:   Antibody: Negative (06/14 0000) Rubella:   RPR: NON REAC (11/06 1615)  HBsAg:    HIV: NON REACTIVE (11/06 1615)  GBS: Positive (01/14 0000)   Assessment/Plan: A: Active labor GBS positive  P: Admit to L&D PCN for GBS  prophylaxis May have epidural when desired  Anticipate vaginal delivery  LEFTWICH-KIRBY, Mikayla Snow 06/15/2011, 10:34 PM

## 2011-06-16 ENCOUNTER — Inpatient Hospital Stay (HOSPITAL_COMMUNITY): Admission: RE | Admit: 2011-06-16 | Payer: BC Managed Care – PPO | Source: Ambulatory Visit | Admitting: Family Medicine

## 2011-06-16 ENCOUNTER — Encounter (HOSPITAL_COMMUNITY): Payer: Self-pay | Admitting: *Deleted

## 2011-06-16 DIAGNOSIS — O9989 Other specified diseases and conditions complicating pregnancy, childbirth and the puerperium: Secondary | ICD-10-CM

## 2011-06-16 DIAGNOSIS — O09529 Supervision of elderly multigravida, unspecified trimester: Secondary | ICD-10-CM

## 2011-06-16 LAB — RPR: RPR Ser Ql: NONREACTIVE

## 2011-06-16 MED ORDER — WITCH HAZEL-GLYCERIN EX PADS: 1.0000 "application " | MEDICATED_PAD | CUTANEOUS | Status: DC | PRN

## 2011-06-16 MED ORDER — LANOLIN HYDROUS EX OINT: TOPICAL_OINTMENT | CUTANEOUS | Status: DC | PRN

## 2011-06-16 MED ORDER — TETANUS-DIPHTH-ACELL PERTUSSIS 5-2.5-18.5 LF-MCG/0.5 IM SUSP: 0.5000 mL | Freq: Once | INTRAMUSCULAR | Status: DC

## 2011-06-16 MED ORDER — BENZOCAINE-MENTHOL 20-0.5 % EX AERO
INHALATION_SPRAY | CUTANEOUS | Status: AC
  Administered 2011-06-16: 1 via TOPICAL
  Filled 2011-06-16: qty 56

## 2011-06-16 MED ORDER — PRENATAL MULTIVITAMIN CH
1.0000 | ORAL_TABLET | Freq: Every day | ORAL | Status: DC
  Administered 2011-06-16 – 2011-06-18 (×3): 1 via ORAL
  Filled 2011-06-16 (×3): qty 1

## 2011-06-16 MED ORDER — SENNOSIDES-DOCUSATE SODIUM 8.6-50 MG PO TABS
2.0000 | ORAL_TABLET | Freq: Every day | ORAL | Status: DC
  Administered 2011-06-16 – 2011-06-17 (×2): 2 via ORAL

## 2011-06-16 MED ORDER — MISOPROSTOL 200 MCG PO TABS
ORAL_TABLET | ORAL | Status: AC
  Filled 2011-06-16: qty 4

## 2011-06-16 MED ORDER — OXYCODONE-ACETAMINOPHEN 5-325 MG PO TABS
1.0000 | ORAL_TABLET | ORAL | Status: DC | PRN
Start: 2011-06-16 — End: 2011-06-18
  Administered 2011-06-16 – 2011-06-18 (×12): 1 via ORAL
  Filled 2011-06-16 (×12): qty 1

## 2011-06-16 MED ORDER — ONDANSETRON HCL 4 MG PO TABS: 4.0000 mg | ORAL_TABLET | ORAL | Status: DC | PRN

## 2011-06-16 MED ORDER — PANTOPRAZOLE SODIUM 40 MG PO TBEC
40.0000 mg | DELAYED_RELEASE_TABLET | Freq: Every day | ORAL | Status: DC
  Administered 2011-06-16: 40 mg via ORAL
  Filled 2011-06-16 (×2): qty 1

## 2011-06-16 MED ORDER — DIPHENHYDRAMINE HCL 25 MG PO CAPS: 25.0000 mg | ORAL_CAPSULE | Freq: Four times a day (QID) | ORAL | Status: DC | PRN

## 2011-06-16 MED ORDER — DIBUCAINE 1 % RE OINT: 1.0000 "application " | TOPICAL_OINTMENT | RECTAL | Status: DC | PRN

## 2011-06-16 MED ORDER — ZOLPIDEM TARTRATE 5 MG PO TABS: 5.0000 mg | ORAL_TABLET | Freq: Every evening | ORAL | Status: DC | PRN

## 2011-06-16 MED ORDER — SIMETHICONE 80 MG PO CHEW: 80.0000 mg | CHEWABLE_TABLET | ORAL | Status: DC | PRN

## 2011-06-16 MED ORDER — BENZOCAINE-MENTHOL 20-0.5 % EX AERO
1.0000 "application " | INHALATION_SPRAY | CUTANEOUS | Status: DC | PRN
  Administered 2011-06-16: 1 via TOPICAL

## 2011-06-16 MED ORDER — MISOPROSTOL 200 MCG PO TABS: 800.0000 ug | ORAL_TABLET | Freq: Once | ORAL | Status: DC

## 2011-06-16 MED ORDER — ONDANSETRON HCL 4 MG/2ML IJ SOLN: 4.0000 mg | INTRAMUSCULAR | Status: DC | PRN

## 2011-06-16 NOTE — Anesthesia Postprocedure Evaluation (Signed)
  Anesthesia Post-op Note  Patient: Mikayla Snow  Procedure(s) Performed: * No procedures listed *  Patient Location: PACU and Mother/Baby  Anesthesia Type: Epidural  Level of Consciousness: awake  Airway and Oxygen Therapy: Patient Spontanous Breathing  Post-op Pain: none  Post-op Assessment: Patient's Cardiovascular Status Stable, Respiratory Function Stable, Adequate PO intake and Pain level controlled  Post-op Vital Signs: Reviewed and stable  Complications: No apparent anesthesia complications

## 2011-06-16 NOTE — Progress Notes (Signed)
Attempt to get pt up to BR to void, pt became very dizzy & nauseaous.  Restarted LR bolus at this time.  Pt back to bed to rest & get fluid bolus.  Will reattempt to transfer after IV bolus.  Pt has history of fainting after last delivery.

## 2011-06-17 MED ORDER — RHO D IMMUNE GLOBULIN 1500 UNIT/2ML IJ SOLN
300.0000 ug | Freq: Once | INTRAMUSCULAR | Status: AC
  Administered 2011-06-17: 300 ug via INTRAMUSCULAR
  Filled 2011-06-17: qty 2

## 2011-06-17 MED ORDER — PANTOPRAZOLE SODIUM 40 MG PO TBEC
40.0000 mg | DELAYED_RELEASE_TABLET | Freq: Every day | ORAL | Status: DC
  Administered 2011-06-17: 40 mg via ORAL
  Filled 2011-06-17 (×2): qty 1

## 2011-06-17 NOTE — Progress Notes (Signed)
Post Partum Day 1 Subjective: no complaints, up ad lib, voiding, tolerating PO and + flatus  Objective: Blood pressure 102/66, pulse 80, temperature 97.5 F (36.4 C), temperature source Oral, resp. rate 18, height 5' 5.5" (1.664 m), weight 174 lb (78.926 kg), last menstrual period 09/06/2010, SpO2 97.00%, unknown if currently breastfeeding.  Physical Exam:  General: alert, cooperative and no distress Lochia: appropriate Uterine Fundus: firm Incision: n/a  DVT Evaluation: No evidence of DVT seen on physical exam.   Basename 06/15/11 2230  HGB 10.3*  HCT 31.8*    Assessment/Plan: Plan for discharge tomorrow and Breastfeeding Declines early discharge today Wants to take Protonix in the morning instead of noon   LOS: 2 days   [facility] 06/17/2011, 7:37 AM

## 2011-06-18 LAB — RH IG WORKUP (INCLUDES ABO/RH): Unit division: 0

## 2011-06-18 MED ORDER — OXYCODONE-ACETAMINOPHEN 5-325 MG PO TABS: 1.0000 | ORAL_TABLET | ORAL | Status: AC | PRN

## 2011-06-18 NOTE — Progress Notes (Addendum)
Patient ID: Mikayla Snow, female   DOB: Jun 16, 1975, 36 y.o.   MRN: [id number] Post Partum Day 2 Subjective: no complaints, up ad lib, voiding, tolerating PO and + flatus  Objective: Blood pressure 119/74, pulse 80, temperature 98 F (36.7 C), temperature source Oral, resp. rate 18, height 5' 5.5" (1.664 m), weight 78.926 kg (174 lb), last menstrual period 09/06/2010, SpO2 97.00%.  Physical Exam:  General: alert, cooperative and no distress Lochia: appropriate Uterine Fundus: firm Incision: n/a  DVT Evaluation: No evidence of DVT seen on physical exam.   Basename 06/15/11 2230  HGB 10.3*  HCT 31.8*    Assessment/Plan: Discharge home and Breastfeeding Contraception OCP   LOS: 3 days    D. Piloto [employer]. MD PGY-1 06/18/2011, 8:37 AM

## 2011-06-18 NOTE — Discharge Summary (Signed)
Obstetric Discharge Summary Reason for Admission: onset of labor Prenatal Procedures: ultrasound Intrapartum Procedures: spontaneous vaginal delivery Postpartum Procedures: none Complications-Operative and Postpartum: 2nd degree perineal laceration Hemoglobin  Date Value Range Status  06/15/2011 10.3* 12.0-15.0 (g/dL) Final     HCT  Date Value Range Status  06/15/2011 31.8* 36.0-46.0 (%) Final    Discharge Diagnoses: Term Pregnancy-delivered  Discharge Information: Date: 06/18/2011 Activity: unrestricted Diet: routine Medications: Percocet Condition: stable Instructions: refer to practice specific booklet Discharge to: home Follow-up Information    Follow up with [facility]. (make an appointment in 6 weeks)    Contact information:   [address] [place] [address] [phone]         Newborn Data: Live born female  Birth Weight: 9 lb 0.3 oz (4090 g) APGAR: 9, 9  Home with mother.  D. Piloto Sherron Flemings Paz. MD PGY-1 06/18/2011, 8:41 AM

## 2011-06-18 NOTE — Discharge Instructions (Signed)
Postpartum Care After Vaginal Delivery  After you deliver your baby, you will stay in the hospital for 24 to 72 hours, unless there were problems with the labor or delivery, or you have medical problems. While you are in the hospital, you will receive help and instructions on how to care for yourself and your baby.  Your doctor will order pain medicine, in case you need it. You will have a small amount of bleeding from your vagina and should change your sanitary pad frequently. Wash your hands thoroughly with soap and water for at least 20 seconds after changing pads and using the toilet. Let the nurses know if you begin to pass blood clots or your bleeding increases. Do not flush blood clots down the toilet before having the nurse look at them, to make sure there is no placental tissue with them.  If you had an intravenous (IV), it will be removed within 24 hours, if there are no problems. The first time you get out of bed or take a shower, call the nurse to help you because you may get weak, lightheaded, or even faint. If you are breastfeeding, you may feel painful contractions of your uterus for a couple of weeks. This is normal. The contractions help your uterus get back to normal size. If you are not breastfeeding, wear a supportive bra and handle your breasts as little as possible until your milk has dried up. Hormones should not be given to dry up the breasts, because they can cause blood clots. You will be given your normal diet, unless you have diabetes or other medical problems.   The nurses may put an ice pack on your episiotomy (surgically enlarged opening), if you have one, to reduce the pain and swelling. On rare occasions, you may not be able to urinate and the nurse will need to empty your bladder with a catheter. If you had a postpartum tubal ligation ("tying tubes," female sterilization), it should not make your stay in the hospital longer.  You may have your baby in your room with you as much as  you like, unless you or the baby has a problem. Use the bassinet (basket) for the baby when going to and from the nursery. Do not carry the baby. Do not leave the postpartum area. If the mother is Rh negative (lacks a protein on the red blood cells) and the baby is Rh positive, the mother should get a Rho-gam shot to prevent Rh problems with future pregnancies.  You may be given written instructions for you and your baby, and necessary medicines, when you are discharged from the hospital. Be sure you understand and follow the instructions as advised.  HOME CARE INSTRUCTIONS   · Follow instructions and take the medicines given to you.   · Only take over-the-counter or prescription medicines for pain, discomfort, or fever as directed by your caregiver.   · Do not take aspirin, because it can cause bleeding.   · Increase your activities a little bit every day to build up your strength and endurance.   · Do not drink alcohol, especially if you are breastfeeding or taking pain medicine.   · Take your temperature twice a day and record it.   · You may have a small amount of bleeding or spotting for 2 to 4 weeks. This is normal.   · Do not use tampons or douche. Use sanitary pads.   · Try to have someone stay and help you for a   few days when you go home.   · Try to rest or take a nap when the baby is sleeping.   · If you are breastfeeding, wear a good support bra. If you are not breastfeeding, wear a supportive bra and do not stimulate your nipples.   · Eat a healthy, nutritious diet and continue to take your prenatal vitamins.   · Do not drive, do any heavy activities, or travel until your caregiver tells you it is okay.   · Do not have intercourse until your caregiver gives you permission to do so.   · Ask your caregiver when you can begin to exercise and what type of exercises to do.   · Call your caregiver if you think you are having a problem from your delivery.   · Call your pediatrician if you are having a problem  with the baby.   · Schedule your postpartum visit and keep it.   SEEK MEDICAL CARE IF:   · You have a temperature of 100° F (37.8° C) or higher.   · You have increased vaginal bleeding or are passing clots. Save any clots to show your caregiver.   · You have bloody urine or pain when you urinate.   · You have a bad smelling vaginal discharge.   · You have increasing pain or swelling on your episiotomy.   · You develop a severe headache.   · You feel depressed.   · The episiotomy is separating.   · You become dizzy or lightheaded.   · You develop a rash.   · You have a reaction or problems with your medicine.   · You have pain, redness, or swelling at the intravenous site.   SEEK IMMEDIATE MEDICAL CARE IF:   · You have chest pain.   · You develop shortness of breath.   · You pass out.   · You develop pain, with or without swelling or redness in your leg.   · You develop heavy vaginal bleeding, with or without blood clots.   · You develop stomach pain.   · You develop a bad smelling vaginal discharge.   MAKE SURE YOU:   · Understand these instructions.   · Will watch your condition.   · Will get help right away if you are not doing well or get worse.   Document Released: 02/13/2007 Document Revised: 12/29/2010 Document Reviewed: 02/25/2009  [employer]® Patient Information ©2012 [employer].

## 2011-06-19 NOTE — Discharge Summary (Signed)
Agree with above note.  Da Authement H. 06/19/2011 8:19 PM  

## 2011-07-15 IMAGING — US US OB LIMITED
1 series · 7 of 7 positions shown · non-contrast
Comparison: none

OBSTETRICAL ULTRASOUND:
 This ultrasound was performed in The [HOSPITAL], and the [facility] report will be stored to [REDACTED] PACS.

[Series 1: us ob limited · 7 of 7 slices shown]
[im 1/7]
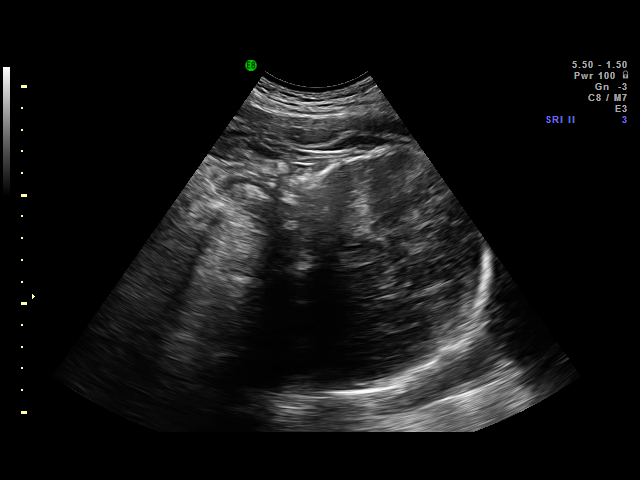
[im 2/7]
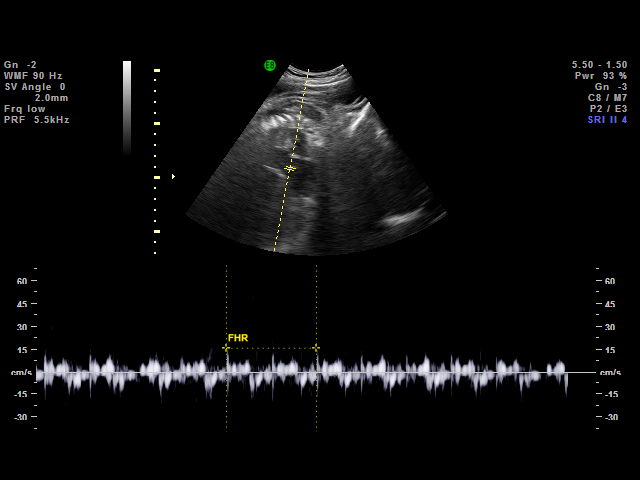
[im 3/7]
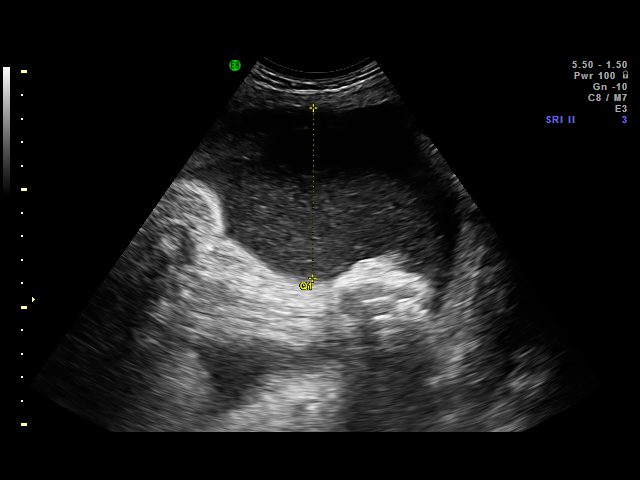
[im 4/7]
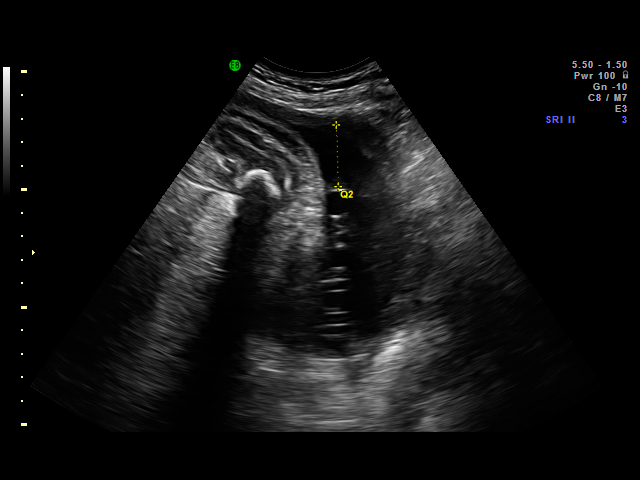
[im 5/7]
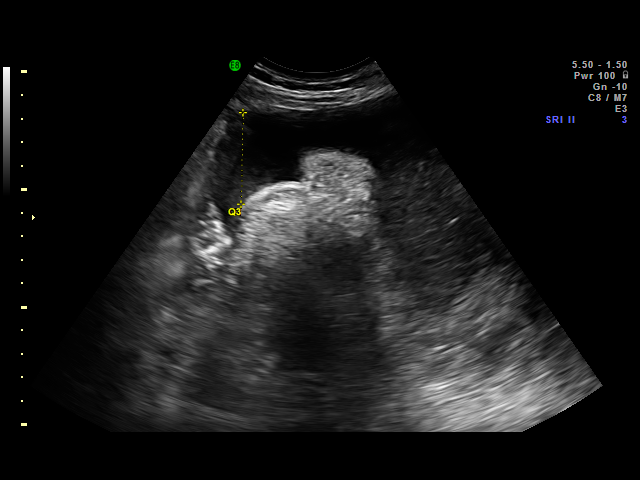
[im 6/7]
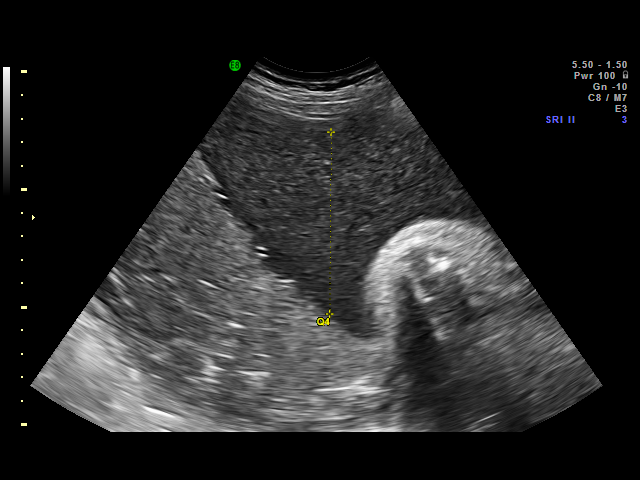
[im 7/7]
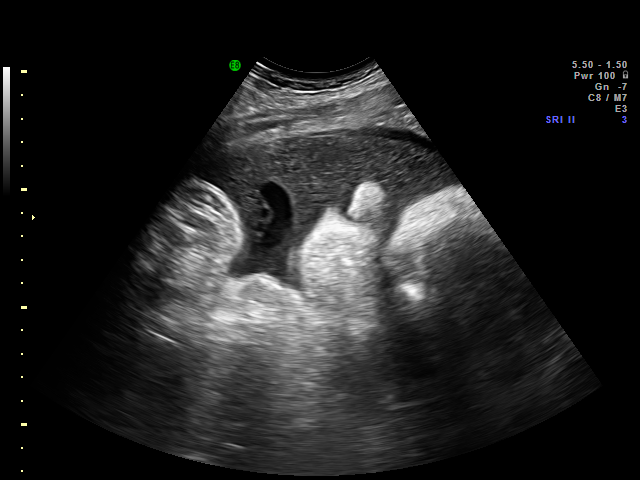

[7 of 7 positions shown; findings below may reference images not displayed]

IMPRESSION: [facility] has also been faxed to the ordering physician.

## 2011-07-28 ENCOUNTER — Encounter: Payer: Self-pay | Admitting: Obstetrics & Gynecology

## 2011-07-28 ENCOUNTER — Ambulatory Visit: Payer: BC Managed Care – PPO | Admitting: Obstetrics & Gynecology

## 2011-07-28 MED ORDER — NORETHINDRONE 0.35 MG PO TABS: 1.0000 | ORAL_TABLET | Freq: Every day | ORAL | Status: DC

## 2011-07-28 NOTE — Progress Notes (Unsigned)
  Subjective:    Patient ID: Mikayla Snow, female    DOB: [birth year], 36 y.o.   MRN: [id number]  HPI  [name] is here for her 6 week post partum exam. 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 She denies postpartum depression. She reports normal bowel and bladder function. She has not had sex since delivery. She wants to use the mini pill while breastfeeding. She understands its 80% effectiveness rate. Her daughter is growing well and is healthy. Review of Systems her pap smear is due   Objective:   Physical Exam Atrophic vulva/vagina c/w breastfeeding state 2nd degree cystocele and rectocele Uterus involuted, NSSA, NT, no adnexal masses       Assessment & Plan:  Post partum- doing well RTC for annual

## 2011-07-28 NOTE — Progress Notes (Unsigned)
Patient is here for routine post partum.  She is doing well.  No signs of post partum depression this time.  Her bleeding has stopped for about 2 weeks now.

## 2011-08-01 ENCOUNTER — Encounter (HOSPITAL_COMMUNITY): Payer: BC Managed Care – PPO

## 2011-08-03 ENCOUNTER — Encounter: Payer: Self-pay | Admitting: Obstetrics & Gynecology

## 2011-08-03 ENCOUNTER — Ambulatory Visit (INDEPENDENT_AMBULATORY_CARE_PROVIDER_SITE_OTHER): Payer: BC Managed Care – PPO | Admitting: Obstetrics & Gynecology

## 2011-08-03 VITALS — BP 112/71 | HR 69 | Ht 65.5 in | Wt 159.0 lb

## 2011-08-03 DIAGNOSIS — Z124 Encounter for screening for malignant neoplasm of cervix: Secondary | ICD-10-CM

## 2011-08-03 DIAGNOSIS — Z01419 Encounter for gynecological examination (general) (routine) without abnormal findings: Secondary | ICD-10-CM

## 2011-08-03 NOTE — Patient Instructions (Signed)
Preventive Care for Adults, Female A healthy lifestyle and preventive care can promote health and wellness. Preventive health guidelines for women include the following key practices.  A routine yearly physical is a good way to check with your caregiver about your health and preventive screening. It is a chance to share any concerns and updates on your health, and to receive a thorough exam.   Visit your dentist for a routine exam and preventive care every 6 months. Brush your teeth twice a day and floss once a day. Good oral hygiene prevents tooth decay and gum disease.   The frequency of eye exams is based on your age, health, family medical history, use of contact lenses, and other factors. Follow your caregiver's recommendations for frequency of eye exams.   Eat a healthy diet. Foods like vegetables, fruits, whole grains, low-fat dairy products, and lean protein foods contain the nutrients you need without too many calories. Decrease your intake of foods high in solid fats, added sugars, and salt. Eat the right amount of calories for you.Get information about a proper diet from your caregiver, if necessary.   Regular physical exercise is one of the most important things you can do for your health. Most adults should get at least 150 minutes of moderate-intensity exercise (any activity that increases your heart rate and causes you to sweat) each week. In addition, most adults need muscle-strengthening exercises on 2 or more days a week.   Maintain a healthy weight. The body mass index (BMI) is a screening tool to identify possible weight problems. It provides an estimate of body fat based on height and weight. Your caregiver can help determine your BMI, and can help you achieve or maintain a healthy weight.For adults 20 years and older:   A BMI below 18.5 is considered underweight.   A BMI of 18.5 to 24.9 is normal.   A BMI of 25 to 29.9 is considered overweight.   A BMI of 30 and above is  considered obese.   Maintain normal blood lipids and cholesterol levels by exercising and minimizing your intake of saturated fat. Eat a balanced diet with plenty of fruit and vegetables. Blood tests for lipids and cholesterol should begin at age 20 and be repeated every 5 years. If your lipid or cholesterol levels are high, you are over 50, or you are at high risk for heart disease, you may need your cholesterol levels checked more frequently.Ongoing high lipid and cholesterol levels should be treated with medicines if diet and exercise are not effective.   If you smoke, find out from your caregiver how to quit. If you do not use tobacco, do not start.   If you are pregnant, do not drink alcohol. If you are breastfeeding, be very cautious about drinking alcohol. If you are not pregnant and choose to drink alcohol, do not exceed 1 drink per day. One drink is considered to be 12 ounces (355 mL) of beer, 5 ounces (148 mL) of wine, or 1.5 ounces (44 mL) of liquor.   Avoid use of street drugs. Do not share needles with anyone. Ask for help if you need support or instructions about stopping the use of drugs.   High blood pressure causes heart disease and increases the risk of stroke. Your blood pressure should be checked at least every 1 to 2 years. Ongoing high blood pressure should be treated with medicines if weight loss and exercise are not effective.   If you are 55 to 38   years old, ask your caregiver if you should take aspirin to prevent strokes.   Diabetes screening involves taking a blood sample to check your fasting blood sugar level. This should be done once every 3 years, after age 45, if you are within normal weight and without risk factors for diabetes. Testing should be considered at a younger age or be carried out more frequently if you are overweight and have at least 1 risk factor for diabetes.   Breast cancer screening is essential preventive care for women. You should practice "breast  self-awareness." This means understanding the normal appearance and feel of your breasts and may include breast self-examination. Any changes detected, no matter how small, should be reported to a caregiver. Women in their 20s and 30s should have a clinical breast exam (CBE) by a caregiver as part of a regular health exam every 1 to 3 years. After age 40, women should have a CBE every year. Starting at age 40, women should consider having a mammography (breast X-ray test) every year. Women who have a family history of breast cancer should talk to their caregiver about genetic screening. Women at a high risk of breast cancer should talk to their caregivers about having magnetic resonance imaging (MRI) and a mammography every year.   The Pap test is a screening test for cervical cancer. A Pap test can show cell changes on the cervix that might become cervical cancer if left untreated. A Pap test is a procedure in which cells are obtained and examined from the lower end of the uterus (cervix).   Women should have a Pap test starting at age 21.   Between ages 21 and 29, Pap tests should be repeated every 2 years.   Beginning at age 30, you should have a Pap test every 3 years as long as the past 3 Pap tests have been normal.   Some women have medical problems that increase the chance of getting cervical cancer. Talk to your caregiver about these problems. It is especially important to talk to your caregiver if a new problem develops soon after your last Pap test. In these cases, your caregiver may recommend more frequent screening and Pap tests.   The above recommendations are the same for women who have or have not gotten the vaccine for human papillomavirus (HPV).   If you had a hysterectomy for a problem that was not cancer or a condition that could lead to cancer, then you no longer need Pap tests. Even if you no longer need a Pap test, a regular exam is a good idea to make sure no other problems are  starting.   If you are between ages 65 and 70, and you have had normal Pap tests going back 10 years, you no longer need Pap tests. Even if you no longer need a Pap test, a regular exam is a good idea to make sure no other problems are starting.   If you have had past treatment for cervical cancer or a condition that could lead to cancer, you need Pap tests and screening for cancer for at least 20 years after your treatment.   If Pap tests have been discontinued, risk factors (such as a new sexual partner) need to be reassessed to determine if screening should be resumed.   The HPV test is an additional test that may be used for cervical cancer screening. The HPV test looks for the virus that can cause the cell changes on the cervix.   The cells collected during the Pap test can be tested for HPV. The HPV test could be used to screen women aged 30 years and older, and should be used in women of any age who have unclear Pap test results. After the age of 30, women should have HPV testing at the same frequency as a Pap test.   Colorectal cancer can be detected and often prevented. Most routine colorectal cancer screening begins at the age of 50 and continues through age 75. However, your caregiver may recommend screening at an earlier age if you have risk factors for colon cancer. On a yearly basis, your caregiver may provide home test kits to check for hidden blood in the stool. Use of a small camera at the end of a tube, to directly examine the colon (sigmoidoscopy or colonoscopy), can detect the earliest forms of colorectal cancer. Talk to your caregiver about this at age 50, when routine screening begins. Direct examination of the colon should be repeated every 5 to 10 years through age 75, unless early forms of pre-cancerous polyps or small growths are found.   Hepatitis C blood testing is recommended for all people born from 1945 through 1965 and any individual with known risks for hepatitis C.    Practice safe sex. Use condoms and avoid high-risk sexual practices to reduce the spread of sexually transmitted infections (STIs). STIs include gonorrhea, chlamydia, syphilis, trichomonas, herpes, HPV, and human immunodeficiency virus (HIV). Herpes, HIV, and HPV are viral illnesses that have no cure. They can result in disability, cancer, and death. Sexually active women aged 25 and younger should be checked for chlamydia. Older women with new or multiple partners should also be tested for chlamydia. Testing for other STIs is recommended if you are sexually active and at increased risk.   Osteoporosis is a disease in which the bones lose minerals and strength with aging. This can result in serious bone fractures. The risk of osteoporosis can be identified using a bone density scan. Women ages 65 and over and women at risk for fractures or osteoporosis should discuss screening with their caregivers. Ask your caregiver whether you should take a calcium supplement or vitamin D to reduce the rate of osteoporosis.   Menopause can be associated with physical symptoms and risks. Hormone replacement therapy is available to decrease symptoms and risks. You should talk to your caregiver about whether hormone replacement therapy is right for you.   Use sunscreen with sun protection factor (SPF) of 30 or more. Apply sunscreen liberally and repeatedly throughout the day. You should seek shade when your shadow is shorter than you. Protect yourself by wearing long sleeves, pants, a wide-brimmed hat, and sunglasses year round, whenever you are outdoors.   Once a month, do a whole body skin exam, using a mirror to look at the skin on your back. Notify your caregiver of new moles, moles that have irregular borders, moles that are larger than a pencil eraser, or moles that have changed in shape or color.   Stay current with required immunizations.   Influenza. You need a dose every fall (or winter). The composition of  the flu vaccine changes each year, so being vaccinated once is not enough.   Pneumococcal polysaccharide. You need 1 to 2 doses if you smoke cigarettes or if you have certain chronic medical conditions. You need 1 dose at age 65 (or older) if you have never been vaccinated.   Tetanus, diphtheria, pertussis (Tdap, Td). Get 1 dose of   Tdap vaccine if you are younger than age 65, are over 65 and have contact with an infant, are a healthcare worker, are pregnant, or simply want to be protected from whooping cough. After that, you need a Td booster dose every 10 years. Consult your caregiver if you have not had at least 3 tetanus and diphtheria-containing shots sometime in your life or have a deep or dirty wound.   HPV. You need this vaccine if you are a woman age 26 or younger. The vaccine is given in 3 doses over 6 months.   Measles, mumps, rubella (MMR). You need at least 1 dose of MMR if you were born in 1957 or later. You may also need a second dose.   Meningococcal. If you are age 19 to 21 and a first-year college student living in a residence hall, or have one of several medical conditions, you need to get vaccinated against meningococcal disease. You may also need additional booster doses.   Zoster (shingles). If you are age 60 or older, you should get this vaccine.   Varicella (chickenpox). If you have never had chickenpox or you were vaccinated but received only 1 dose, talk to your caregiver to find out if you need this vaccine.   Hepatitis A. You need this vaccine if you have a specific risk factor for hepatitis A virus infection or you simply wish to be protected from this disease. The vaccine is usually given as 2 doses, 6 to 18 months apart.   Hepatitis B. You need this vaccine if you have a specific risk factor for hepatitis B virus infection or you simply wish to be protected from this disease. The vaccine is given in 3 doses, usually over 6 months.  Preventive Services /  Frequency Ages 19 to 39  Blood pressure check.** / Every 1 to 2 years.   Lipid and cholesterol check.** / Every 5 years beginning at age 20.   Clinical breast exam.** / Every 3 years for women in their 20s and 30s.   Pap test.** / Every 2 years from ages 21 through 29. Every 3 years starting at age 30 through age 65 or 70 with a history of 3 consecutive normal Pap tests.   HPV screening.** / Every 3 years from ages 30 through ages 65 to 70 with a history of 3 consecutive normal Pap tests.   Hepatitis C blood test.** / For any individual with known risks for hepatitis C.   Skin self-exam. / Monthly.   Influenza immunization.** / Every year.   Pneumococcal polysaccharide immunization.** / 1 to 2 doses if you smoke cigarettes or if you have certain chronic medical conditions.   Tetanus, diphtheria, pertussis (Tdap, Td) immunization. / A one-time dose of Tdap vaccine. After that, you need a Td booster dose every 10 years.   HPV immunization. / 3 doses over 6 months, if you are 26 and younger.   Measles, mumps, rubella (MMR) immunization. / You need at least 1 dose of MMR if you were born in 1957 or later. You may also need a second dose.   Meningococcal immunization. / 1 dose if you are age 19 to 21 and a first-year college student living in a residence hall, or have one of several medical conditions, you need to get vaccinated against meningococcal disease. You may also need additional booster doses.   Varicella immunization.** / Consult your caregiver.   Hepatitis A immunization.** / Consult your caregiver. 2 doses, 6 to 18 months   apart.   Hepatitis B immunization.** / Consult your caregiver. 3 doses usually over 6 months.  Ages 40 to 64  Blood pressure check.** / Every 1 to 2 years.   Lipid and cholesterol check.** / Every 5 years beginning at age 20.   Clinical breast exam.** / Every year after age 40.   Mammogram.** / Every year beginning at age 40 and continuing for as  long as you are in good health. Consult with your caregiver.   Pap test.** / Every 3 years starting at age 30 through age 65 or 70 with a history of 3 consecutive normal Pap tests.   HPV screening.** / Every 3 years from ages 30 through ages 65 to 70 with a history of 3 consecutive normal Pap tests.   Fecal occult blood test (FOBT) of stool. / Every year beginning at age 50 and continuing until age 75. You may not need to do this test if you get a colonoscopy every 10 years.   Flexible sigmoidoscopy or colonoscopy.** / Every 5 years for a flexible sigmoidoscopy or every 10 years for a colonoscopy beginning at age 50 and continuing until age 75.   Hepatitis C blood test.** / For all people born from 1945 through 1965 and any individual with known risks for hepatitis C.   Skin self-exam. / Monthly.   Influenza immunization.** / Every year.   Pneumococcal polysaccharide immunization.** / 1 to 2 doses if you smoke cigarettes or if you have certain chronic medical conditions.   Tetanus, diphtheria, pertussis (Tdap, Td) immunization.** / A one-time dose of Tdap vaccine. After that, you need a Td booster dose every 10 years.   Measles, mumps, rubella (MMR) immunization. / You need at least 1 dose of MMR if you were born in 1957 or later. You may also need a second dose.   Varicella immunization.** / Consult your caregiver.   Meningococcal immunization.** / Consult your caregiver.   Hepatitis A immunization.** / Consult your caregiver. 2 doses, 6 to 18 months apart.   Hepatitis B immunization.** / Consult your caregiver. 3 doses, usually over 6 months.  Ages 65 and over  Blood pressure check.** / Every 1 to 2 years.   Lipid and cholesterol check.** / Every 5 years beginning at age 20.   Clinical breast exam.** / Every year after age 40.   Mammogram.** / Every year beginning at age 40 and continuing for as long as you are in good health. Consult with your caregiver.   Pap test.** /  Every 3 years starting at age 30 through age 65 or 70 with a 3 consecutive normal Pap tests. Testing can be stopped between 65 and 70 with 3 consecutive normal Pap tests and no abnormal Pap or HPV tests in the past 10 years.   HPV screening.** / Every 3 years from ages 30 through ages 65 or 70 with a history of 3 consecutive normal Pap tests. Testing can be stopped between 65 and 70 with 3 consecutive normal Pap tests and no abnormal Pap or HPV tests in the past 10 years.   Fecal occult blood test (FOBT) of stool. / Every year beginning at age 50 and continuing until age 75. You may not need to do this test if you get a colonoscopy every 10 years.   Flexible sigmoidoscopy or colonoscopy.** / Every 5 years for a flexible sigmoidoscopy or every 10 years for a colonoscopy beginning at age 50 and continuing until age 75.   Hepatitis   C blood test.** / For all people born from 1945 through 1965 and any individual with known risks for hepatitis C.   Osteoporosis screening.** / A one-time screening for women ages 65 and over and women at risk for fractures or osteoporosis.   Skin self-exam. / Monthly.   Influenza immunization.** / Every year.   Pneumococcal polysaccharide immunization.** / 1 dose at age 65 (or older) if you have never been vaccinated.   Tetanus, diphtheria, pertussis (Tdap, Td) immunization. / A one-time dose of Tdap vaccine if you are over 65 and have contact with an infant, are a healthcare worker, or simply want to be protected from whooping cough. After that, you need a Td booster dose every 10 years.   Varicella immunization.** / Consult your caregiver.   Meningococcal immunization.** / Consult your caregiver.   Hepatitis A immunization.** / Consult your caregiver. 2 doses, 6 to 18 months apart.   Hepatitis B immunization.** / Check with your caregiver. 3 doses, usually over 6 months.  ** Family history and personal history of risk and conditions may change your caregiver's  recommendations. Document Released: 06/14/2001 Document Revised: 04/07/2011 Document Reviewed: 09/13/2010 [employer] Patient Information 2012 [employer]. 

## 2011-08-03 NOTE — Progress Notes (Signed)
  Subjective:     Mikayla Snow is a 36 y.o. G22P2002 female and is here for a comprehensive physical exam. The patient reports no problems.  On the minipill for contraception, no problems with intercourse.  Denies any abnormal vaginal bleeding, discharge, pain or other symptoms.  The following portions of the patient's history were reviewed and updated as appropriate: allergies, current medications, past family history, past medical history, past social history, past surgical history and problem list.  Review of Systems A comprehensive review of systems was negative.   Objective:    Blood pressure 112/71, pulse 69, height 5' 5.5" (1.664 m), weight 159 lb (72.122 kg), currently breastfeeding. GENERAL: Well-developed, well-nourished female in no acute distress.  HEENT: Normocephalic, atraumatic. Sclerae anicteric.  NECK: Supple. Normal thyroid.  LUNGS: Clear to auscultation bilaterally.  HEART: Regular rate and rhythm. BREASTS: Symmetric with everted nipples. No masses, skin changes, nipple drainage, or lymphadenopathy. ABDOMEN: Soft, nontender, nondistended. No organomegaly. PELVIC: Normal external female genitalia. Vagina is pink and rugated.  Normal discharge. Normal cervix contour. Pap smear obtained. Uterus is normal in size. No adnexal mass or tenderness.  EXTREMITIES: No cyanosis, clubbing, or edema, 2+ distal pulses.   Assessment:    Healthy female exam. Pap done today.    Plan:    Follow up pap smear Routine preventative health maintenance practices recommended Return/ call as needed for any GYN concerns

## 2011-12-15 ENCOUNTER — Ambulatory Visit (INDEPENDENT_AMBULATORY_CARE_PROVIDER_SITE_OTHER): Payer: BC Managed Care – PPO | Admitting: Obstetrics & Gynecology

## 2011-12-15 VITALS — BP 103/71 | Ht 66.0 in | Wt 165.2 lb

## 2011-12-15 DIAGNOSIS — L906 Striae atrophicae: Secondary | ICD-10-CM

## 2011-12-15 NOTE — Progress Notes (Signed)
Bruising under right breast, no pain associated.  Bruise showed up two weeks ago, but just stopped nursing two days ago. Since she has stopped breast feeding does she need to change her birth control?  Pt c/o lesion on right breast.  Noticed after 6 months of nursing  O/ Pt in nad Filed Vitals:   12/15/11 1022  BP: 103/71   Breasts: stretch makr noted on right breast medially.  No nipple discharge.  No nodules on masses noted bilaterally  A/ Breast stretch makes Post partum weight concerns  P/ D/W pt [employer]/ charting food intake F/u prn  clh-S

## 2011-12-15 NOTE — Patient Instructions (Signed)
Skin Conditions During Pregnancy Your body changes in many ways when you are pregnant. This includes the skin. Most skin problems that develop during pregnancy are not serious and are considered a normal part of pregnancy. They will go away on their own after the baby is born. Other skin problems may need treatment. Some conditions may be caused by hormonal changes during pregnancy. Some preexisting skin conditions may become worse during pregnancy.  Stretch marks (striae gravidarum). Almost all women get these during pregnancy. They show up as purple or pink lines across the belly, breasts, thighs, and buttocks. They do not cause other problems. Stretch marks may be caused by hormonal changes, weight gain, and stretching of the skin across the belly and breasts.   Darkening of the skin (hyperpigmentation). This happens to almost all pregnant women. It is more severe in women with a dark complexion. Dark patches may appear on the face, nipples, or genital area. A line of dark skin may stretch from the belly button to the pubic area. Dark patches on the face are sometimes called a "mask of pregnancy."   Spider angiomas. These appear as tiny pink or red lines. They go out from a center point, like the legs of a spider. Usually, they are on the face, neck, and arms. They do not cause other problems. They are most common in women with light complexions.   Palmar erythema. The palms of the hands become red. This happens most often to women with light complexions.   General swelling and redness. This can occur on the face, eyelids, fingers, or toes.   Pruritic urticarial papules and plaques of pregnancy (PUPPP). This rash is itchy, red, and has tiny blisters. It usually starts on the belly and may affect the arms or legs. It does not affect the face. It usually begins later in pregnancy. About a third of all pregnant women get this condition. The cause is unknown. There are no associated problems to the fetus  with this rash. Sometimes, oral steroids are used to calm down the itch. The rash clears after the baby is born.   Prurigo of pregnancy. This appears as red patches and bumps on the arms and legs. About a third of pregnant women get this rash. The cause is unknown. It clears after the baby is born.   Atopic dermatitis. This preexisting condition may get worse during pregnancy. It causes patches of dry, itchy skin.   Acne. Pimples may break out. This can happen to women who have had clear skin for a long time.   Skin tags. These are small flaps of skin that stick out from the body. They are usually harmless. They may grow or become darker during pregnancy.   Moles. These are flat or slightly raised growths. They are usually round and pink or brown. They may grow or become darker during pregnancy.   Intrahepatic cholestasis of pregnancy. This condition is rare. It causes itchy skin. It may run in families. There are increased complications for the fetus. This condition usually resolves after delivery. It can recur with subsequent pregnancies.   Impetigo herpetiformis. This is a form of severe pustular psoriasis occurring in pregnancy. Usually, delivery is the only method of resolving this rash.   Pruritic folliculitis of pregnancy. This is rare. A woman with this condition has pimple-like skin growths. It develops in the middle or later stages of pregnancy. Why it occurs during pregnancy is unknown. It usually resolves 2 to 3 weeks after delivery.  Pemphigoid gestationis. This condition is very rare. It is an autoimmune disease. It causes a severely itchy rash and blisters. It spares the face, scalp, and inside of the mouth. It usually resolves 3 months after delivery. It may recur with subsequent pregnancies.  DIAGNOSIS  To figure out what type of skin problem you have, your caregiver will do a physical exam. Often, this is all that is needed. In some cases, your caregiver may suggest that you  see a skin specialist (dermatologist). Your caregiver may also order blood tests to help determine what is causing a rash. TREATMENT  Most of the time, skin changes that occur during pregnancy do not need treatment. They will clear up after childbirth. However, treatment is often suggested for more severe cases. Treatment options may include:  Creams and lotions. For stretch marks, some people use creams that contain vitamin E, cocoa butter, or aloe vera. However, there is not much proof that this works and there is potential for allergic contact dermatitis with these creams.   Sunscreen/sunblock. This may help limit dark spots when the skin is exposed to the sun. Use a sunscreen with a sun protection factor (SPF) of at least 15.   Corticosteroids. These are medicines that control swelling and itching. They are prescribed for some rashes. Often, a medicated cream or ointment is suggested. For severe rashes, a pill may be given.   Oral antihistamines. These are medicines taken by mouth to help control itching.   Antibiotics. Scratching can cause a rash to become infected. Then, an antibiotic medicine may be needed. This medicine may also be prescribed for bad acne.   Surgery. A simple procedure may be done to remove skin tags or moles.  HOME CARE INSTRUCTIONS  Different conditions may have different instructions. In general:  Follow all your caregiver's directions about medicines while you are pregnant. Do not use any over-the-counter medicines until you have checked with your caregiver. This includes medicated creams and lotions. Many medicines are not safe to use when you are pregnant.   Avoid time in the sun. This will help keep your skin from darkening. When you must be outside, use sunscreen and wear a hat with a wide brim. This will protect your face.   To avoid problems from stretched skin:   Do not sit or stand for long periods of time.   Get regular exercise. This helps keep your  skin in good condition.   Use gentle soap to help prevent acne.   Do not get too hot or too sweaty. This makes some skin rashes worse.   Wear loose clothes made of a soft fabric. They should not irritate your skin.   For itching, add oatmeal or cornstarch to your bathwater.   Use a skin moisturizer. Ask your caregiver for a suggestion.  SEEK MEDICAL CARE IF:   You have any questions or concerns about your pregnancy.   You have questions about any new skin condition.   Your skin itches or you develop a rash.  SEEK IMMEDIATE MEDICAL CARE IF:   Your itching becomes severe.   Your rash spreads, gets much worse, or becomes painful.   Your skin growth or rash bleeds.   Your growth or mole changes quickly.   You have a fever.  Document Released: 05/21/2010 Document Revised: 04/07/2011 Document Reviewed: 05/21/2010 [facility] Patient Information 2012 [place], [place].

## 2012-01-04 ENCOUNTER — Telehealth: Payer: Self-pay | Admitting: *Deleted

## 2012-01-04 DIAGNOSIS — IMO0001 Reserved for inherently not codable concepts without codable children: Secondary | ICD-10-CM

## 2012-01-04 MED ORDER — NORGESTIMATE-ETH ESTRADIOL 0.25-35 MG-MCG PO TABS: 1.0000 | ORAL_TABLET | Freq: Every day | ORAL | Status: DC

## 2012-01-04 NOTE — Telephone Encounter (Signed)
Patient is done breast feeding and would like to begin regular ocp's.

## 2012-02-17 ENCOUNTER — Telehealth: Payer: Self-pay | Admitting: *Deleted

## 2012-02-17 DIAGNOSIS — Z309 Encounter for contraceptive management, unspecified: Secondary | ICD-10-CM

## 2012-02-17 MED ORDER — NORGESTREL-ETHINYL ESTRADIOL 0.3-30 MG-MCG PO TABS: 1.0000 | ORAL_TABLET | Freq: Every day | ORAL | Status: DC

## 2012-02-17 NOTE — Telephone Encounter (Signed)
Patient would like to switch back to lo ovral ocp due to nausea and vomiting with current ocp.

## 2013-11-14 ENCOUNTER — Ambulatory Visit: Payer: BC Managed Care – PPO | Admitting: Obstetrics & Gynecology

## 2013-11-19 ENCOUNTER — Encounter: Payer: Self-pay | Admitting: Obstetrics & Gynecology

## 2013-11-19 ENCOUNTER — Ambulatory Visit (INDEPENDENT_AMBULATORY_CARE_PROVIDER_SITE_OTHER): Payer: BC Managed Care – PPO | Admitting: Obstetrics & Gynecology

## 2013-11-19 ENCOUNTER — Ambulatory Visit: Payer: BC Managed Care – PPO | Admitting: Obstetrics & Gynecology

## 2013-11-19 VITALS — BP 120/86 | HR 74 | Ht 65.0 in | Wt 159.0 lb

## 2013-11-19 DIAGNOSIS — Z01419 Encounter for gynecological examination (general) (routine) without abnormal findings: Secondary | ICD-10-CM

## 2013-11-19 DIAGNOSIS — Z3009 Encounter for other general counseling and advice on contraception: Secondary | ICD-10-CM

## 2013-11-19 DIAGNOSIS — Z0000 Encounter for general adult medical examination without abnormal findings: Secondary | ICD-10-CM

## 2013-11-19 DIAGNOSIS — Z1151 Encounter for screening for human papillomavirus (HPV): Secondary | ICD-10-CM

## 2013-11-19 DIAGNOSIS — Z30019 Encounter for initial prescription of contraceptives, unspecified: Secondary | ICD-10-CM

## 2013-11-19 DIAGNOSIS — Z124 Encounter for screening for malignant neoplasm of cervix: Secondary | ICD-10-CM

## 2013-11-19 MED ORDER — NORGESTREL-ETHINYL ESTRADIOL 0.3-30 MG-MCG PO TABS: 1.0000 | ORAL_TABLET | Freq: Every day | ORAL | Status: DC

## 2013-11-19 NOTE — Progress Notes (Signed)
Subjective:    Mikayla Snow is a 38 y.o. female who presents for an annual exam. The patient has no complaints today. She ran out of OCPs and has been "being careful" for contraception. She would like to restart a low dose  OCP. The patient is sexually active. GYN screening history: last pap: was normal. The patient wears seatbelts: yes. The patient participates in regular exercise: yes. Has the patient ever been transfused or tattooed?: no. The patient reports that there is not domestic violence in her life.   Menstrual History: OB History   Grav Para Term Preterm Abortions TAB SAB Ect Mult Living   2 2 2  0 0 0 0 0 0 2      Menarche age: 83  Patient's last menstrual period was 11/12/2013.    The following portions of the patient's history were reviewed and updated as appropriate: allergies, current medications, past family history, past medical history, past social history, past surgical history and problem list.  Review of Systems A comprehensive review of systems was negative. Married for 16 years, denies dyspareunia. 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 Mammogram due   Objective:    BP 120/86  Pulse 74  Ht 5\' 5"  (1.651 m)  Wt 159 lb (72.122 kg)  BMI 26.46 kg/m2  LMP 11/12/2013  General Appearance:    Alert, cooperative, no distress, appears stated age  Head:    Normocephalic, without obvious abnormality, atraumatic  Eyes:    PERRL, conjunctiva/corneas clear, EOM's intact, fundi    benign, both eyes  Ears:    Normal TM's and external ear canals, both ears  Nose:   Nares normal, septum midline, mucosa normal, no drainage    or sinus tenderness  Throat:   Lips, mucosa, and tongue normal; teeth and gums normal  Neck:   Supple, symmetrical, trachea midline, no adenopathy;    thyroid:  no enlargement/tenderness/nodules; no carotid   bruit or JVD  Back:     Symmetric, no curvature, ROM normal, no CVA tenderness  Lungs:     Clear to auscultation bilaterally,  respirations unlabored  Chest Wall:    No tenderness or deformity   Heart:    Regular rate and rhythm, S1 and S2 normal, no murmur, rub   or gallop  Breast Exam:    No tenderness, masses, or nipple abnormality  Abdomen:     Soft, non-tender, bowel sounds active all four quadrants,    no masses, no organomegaly, rectus diathesis noted  Genitalia:    Normal female without lesion, discharge or tenderness     Extremities:   Extremities normal, atraumatic, no cyanosis or edema  Pulses:   2+ and symmetric all extremities  Skin:   Skin color, texture, turgor normal, no rashes or lesions  Lymph nodes:   Cervical, supraclavicular, and axillary nodes normal  Neurologic:   CNII-XII intact, normal strength, sensation and reflexes    throughout  .    Assessment:    Healthy female exam.  Contraception   Plan:     Breast self exam technique reviewed and patient encouraged to perform self-exam monthly. Thin prep Pap smear. with cotesting Start generic lo ovral (her previous OCP) with NMP

## 2013-11-21 LAB — CYTOLOGY - PAP

## 2013-12-02 ENCOUNTER — Ambulatory Visit (HOSPITAL_COMMUNITY)
Admission: RE | Admit: 2013-12-02 | Discharge: 2013-12-02 | Disposition: A | Discharge status: Home / Self Care | Payer: BC Managed Care – PPO | Source: Ambulatory Visit | Attending: Obstetrics & Gynecology | Admitting: Obstetrics & Gynecology

## 2013-12-02 ENCOUNTER — Other Ambulatory Visit: Payer: Self-pay | Admitting: Obstetrics & Gynecology

## 2013-12-02 DIAGNOSIS — Z1231 Encounter for screening mammogram for malignant neoplasm of breast: Secondary | ICD-10-CM

## 2013-12-02 DIAGNOSIS — Z0000 Encounter for general adult medical examination without abnormal findings: Secondary | ICD-10-CM

## 2013-12-13 ENCOUNTER — Encounter: Payer: Self-pay | Admitting: Internal Medicine

## 2014-03-03 ENCOUNTER — Encounter: Payer: Self-pay | Admitting: Obstetrics & Gynecology

## 2014-11-25 ENCOUNTER — Other Ambulatory Visit: Payer: Self-pay | Admitting: Obstetrics & Gynecology

## 2014-12-09 ENCOUNTER — Ambulatory Visit (INDEPENDENT_AMBULATORY_CARE_PROVIDER_SITE_OTHER): Payer: BC Managed Care – PPO | Admitting: Obstetrics & Gynecology

## 2014-12-09 ENCOUNTER — Encounter: Payer: Self-pay | Admitting: Obstetrics & Gynecology

## 2014-12-09 VITALS — BP 145/90 | HR 76 | Resp 18 | Ht 65.5 in | Wt 167.0 lb

## 2014-12-09 DIAGNOSIS — Z124 Encounter for screening for malignant neoplasm of cervix: Secondary | ICD-10-CM

## 2014-12-09 DIAGNOSIS — Z1151 Encounter for screening for human papillomavirus (HPV): Secondary | ICD-10-CM | POA: Diagnosis not present

## 2014-12-09 DIAGNOSIS — Z01419 Encounter for gynecological examination (general) (routine) without abnormal findings: Secondary | ICD-10-CM | POA: Diagnosis not present

## 2014-12-09 DIAGNOSIS — Z0000 Encounter for general adult medical examination without abnormal findings: Secondary | ICD-10-CM

## 2014-12-09 MED ORDER — NORGESTREL-ETHINYL ESTRADIOL 0.3-30 MG-MCG PO TABS: 1.0000 | ORAL_TABLET | Freq: Every day | ORAL | Status: DC

## 2014-12-09 NOTE — Progress Notes (Signed)
Subjective:    Mikayla Snow is a 39 y.o. MW P2 (3 and 52 yo kids)  female who presents for an annual exam. The patient has no complaints today. The patient is sexually active. GYN screening history: last pap: was normal. The patient wears seatbelts: yes. The patient participates in regular exercise: yes. Has the patient ever been transfused or tattooed?: no. The patient reports that there is not domestic violence in her life.   Menstrual History: OB History    Gravida Para Term Preterm AB TAB SAB Ectopic Multiple Living   2 2 2  0 0 0 0 0 0 2      Menarche age: 1  Patient's last menstrual period was 11/26/2014 (exact date).    The following portions of the patient's history were reviewed and updated as appropriate: allergies, current medications, past family history, past medical history, past social history, past surgical history and problem list.  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   Objective:    BP 148/105 mmHg  Pulse 76  Resp 18  Ht 5' 5.5" (1.664 m)  Wt 167 lb (75.751 kg)  BMI 27.36 kg/m2  LMP 11/26/2014 (Exact Date)  General Appearance:    Alert, cooperative, no distress, appears stated age  Head:    Normocephalic, without obvious abnormality, atraumatic  Eyes:    PERRL, conjunctiva/corneas clear, EOM's intact, fundi    benign, both eyes  Ears:    Normal TM's and external ear canals, both ears  Nose:   Nares normal, septum midline, mucosa normal, no drainage    or sinus tenderness  Throat:   Lips, mucosa, and tongue normal; teeth and gums normal  Neck:   Supple, symmetrical, trachea midline, no adenopathy;    thyroid:  no enlargement/tenderness/nodules; no carotid   bruit or JVD  Back:     Symmetric, no curvature, ROM normal, no CVA tenderness  Lungs:     Clear to auscultation bilaterally, respirations unlabored  Chest Wall:    No tenderness or deformity   Heart:    Regular rate and  rhythm, S1 and S2 normal, no murmur, rub   or gallop  Breast Exam:    No tenderness, masses, or nipple abnormality  Abdomen:     Soft, non-tender, bowel sounds active all four quadrants,    no masses, no organomegaly  Genitalia:    Normal female without lesion, discharge or tenderness, NSSA, NT, mobile, normal adnexal exam     Extremities:   Extremities normal, atraumatic, no cyanosis or edema  Pulses:   2+ and symmetric all extremities  Skin:   Skin color, texture, turgor normal, no rashes or lesions  Lymph nodes:   Cervical, supraclavicular, and axillary nodes normal  Neurologic:   CNII-XII intact, normal strength, sensation and reflexes    throughout  .    Assessment:    Healthy female exam.    Plan:     Breast self exam technique reviewed and patient encouraged to perform self-exam monthly. Mammogram. Thin prep Pap smear. with cotesting Refill OCPs

## 2014-12-10 LAB — CYTOLOGY - PAP

## 2014-12-16 ENCOUNTER — Ambulatory Visit (HOSPITAL_COMMUNITY)
Admission: RE | Admit: 2014-12-16 | Discharge: 2014-12-16 | Disposition: A | Discharge status: Home / Self Care | Payer: BC Managed Care – PPO | Source: Ambulatory Visit | Attending: Obstetrics & Gynecology | Admitting: Obstetrics & Gynecology

## 2014-12-16 DIAGNOSIS — Z1231 Encounter for screening mammogram for malignant neoplasm of breast: Secondary | ICD-10-CM | POA: Insufficient documentation

## 2014-12-16 DIAGNOSIS — Z0000 Encounter for general adult medical examination without abnormal findings: Secondary | ICD-10-CM

## 2015-07-23 ENCOUNTER — Ambulatory Visit: Payer: BC Managed Care – PPO | Admitting: Internal Medicine

## 2015-08-10 ENCOUNTER — Ambulatory Visit (INDEPENDENT_AMBULATORY_CARE_PROVIDER_SITE_OTHER): Payer: BC Managed Care – PPO | Admitting: Internal Medicine

## 2015-08-10 ENCOUNTER — Encounter: Payer: Self-pay | Admitting: Internal Medicine

## 2015-08-10 VITALS — BP 136/90 | HR 70 | Temp 98.7°F | Ht 66.0 in | Wt 166.5 lb

## 2015-08-10 DIAGNOSIS — D649 Anemia, unspecified: Secondary | ICD-10-CM

## 2015-08-10 DIAGNOSIS — K219 Gastro-esophageal reflux disease without esophagitis: Secondary | ICD-10-CM

## 2015-08-10 DIAGNOSIS — Z91048 Other nonmedicinal substance allergy status: Secondary | ICD-10-CM

## 2015-08-10 DIAGNOSIS — Z9109 Other allergy status, other than to drugs and biological substances: Secondary | ICD-10-CM

## 2015-08-10 DIAGNOSIS — Z8719 Personal history of other diseases of the digestive system: Secondary | ICD-10-CM | POA: Diagnosis not present

## 2015-08-10 NOTE — Patient Instructions (Signed)
Anemia, Nonspecific Anemia is a condition in which the concentration of red blood cells or hemoglobin in the blood is below normal. Hemoglobin is a substance in red blood cells that carries oxygen to the tissues of the body. Anemia results in not enough oxygen reaching these tissues.  CAUSES  Common causes of anemia include:   Excessive bleeding. Bleeding may be internal or external. This includes excessive bleeding from periods (in women) or from the intestine.   Poor nutrition.   Chronic kidney, thyroid, and liver disease.  Bone marrow disorders that decrease red blood cell production.  Cancer and treatments for cancer.  HIV, AIDS, and their treatments.  Spleen problems that increase red blood cell destruction.  Blood disorders.  Excess destruction of red blood cells due to infection, medicines, and autoimmune disorders. SIGNS AND SYMPTOMS   Minor weakness.   Dizziness.   Headache.  Palpitations.   Shortness of breath, especially with exercise.   Paleness.  Cold sensitivity.  Indigestion.  Nausea.  Difficulty sleeping.  Difficulty concentrating. Symptoms may occur suddenly or they may develop slowly.  DIAGNOSIS  Additional blood tests are often needed. These help your health care provider determine the best treatment. Your health care provider will check your stool for blood and look for other causes of blood loss.  TREATMENT  Treatment varies depending on the cause of the anemia. Treatment can include:   Supplements of iron, vitamin B12, or folic acid.   Hormone medicines.   A blood transfusion. This may be needed if blood loss is severe.   Hospitalization. This may be needed if there is significant continual blood loss.   Dietary changes.  Spleen removal. HOME CARE INSTRUCTIONS Keep all follow-up appointments. It often takes many weeks to correct anemia, and having your health care provider check on your condition and your response to  treatment is very important. SEEK IMMEDIATE MEDICAL CARE IF:   You develop extreme weakness, shortness of breath, or chest pain.   You become dizzy or have trouble concentrating.  You develop heavy vaginal bleeding.   You develop a rash.   You have bloody or black, tarry stools.   You faint.   You vomit up blood.   You vomit repeatedly.   You have abdominal pain.  You have a fever or persistent symptoms for more than 2-3 days.   You have a fever and your symptoms suddenly get worse.   You are dehydrated.  MAKE SURE YOU:  Understand these instructions.  Will watch your condition.  Will get help right away if you are not doing well or get worse.   This information is not intended to replace advice given to you by your health care provider. Make sure you discuss any questions you have with your health care provider.   Document Released: 05/26/2004 Document Revised: 12/19/2012 Document Reviewed: 10/12/2012 Elsevier Interactive Patient Education 2016 [employer]  

## 2015-08-10 NOTE — Progress Notes (Signed)
Pre visit review using our clinic review tool, if applicable. No additional management support is needed unless otherwise documented below in the visit note. 

## 2015-08-10 NOTE — Progress Notes (Signed)
HPI  Pt presents to the clinic today to establish care and for management of the conditions listed below. She has not had a PCP in many years but has been going to Dr. Hulan Fray for GYN care.  GERD: She had an esophageal stricture dilated in the past. She takes Prilosec daily.  Seasonal Allergies: She is taking Zyrtec daily. She occasionally uses Flonase if her symptoms are really bad. She is allergic to pet dander, pollen and dust.  Anemia: She went to go donate blood. They told her that her Hgb was too low at 9.8. She denies fatigue, shortness of breath. She is having regular menstrual cycles. Her periods are not heavy. She denies blood in her stool. She is not taking any iron supplement consistently.  Flu: 2015 Tetanus: unsure  Mammogram: 10/2014 Pap Smear: 10/2014 Vision Screening: yearly Dentist: biannually  Past Medical History  Diagnosis Date  . Esophageal stricture   . AMA (advanced maternal age) multigravida 18+   . GERD (gastroesophageal reflux disease)   . History of chickenpox   . History of hay fever   . History of urinary incontinence     Current Outpatient Prescriptions  Medication Sig Dispense Refill  . Cetirizine HCl (ZYRTEC PO) Take by mouth.      . norgestrel-ethinyl estradiol (CRYSELLE-28) 0.3-30 MG-MCG tablet Take 1 tablet by mouth daily. 28 tablet 11  . omeprazole (PRILOSEC) 20 MG capsule TAKE ONE CAPSULE BY MOUTH EVERY DAY 30 MINUTES BEFORE MEAL 30 capsule 3   No current facility-administered medications for this visit.    No Known Allergies  Family History  Problem Relation Age of Onset  . Heart disease Mother   . Arthritis Paternal Grandmother     severe in hands  . Stroke Paternal Grandfather   . Hypertension Mother   . Hypertension Father   . Depression Mother     Social History   Social History  . Marital Status: Married    Spouse Name: N/A  . Number of Children: N/A  . Years of Education: N/A   Occupational History  . Not on file.    Social History Main Topics  . Smoking status: Never Smoker   . Smokeless tobacco: Never Used  . Alcohol Use: 0.0 oz/week    0 Standard drinks or equivalent per week     Comment: occ  . Drug Use: No  . Sexual Activity:    Partners: Male    Birth Control/ Protection: Pill   Other Topics Concern  . Not on file   Social History Narrative    ROS:  Constitutional: Denies fever, malaise, fatigue, headache or abrupt weight changes.  HEENT: Denies eye pain, eye redness, ear pain, ringing in the ears, wax buildup, runny nose, nasal congestion, bloody nose, or sore throat. Respiratory: Denies difficulty breathing, shortness of breath, cough or sputum production.   Cardiovascular: Denies chest pain, chest tightness, palpitations or swelling in the hands or feet.  Gastrointestinal: Denies abdominal pain, bloating, constipation, diarrhea or blood in the stool.  Skin: Denies redness, rashes, lesions or ulcercations.  Neurological: Denies dizziness, difficulty with memory, difficulty with speech or problems with balance and coordination.  Psych: Denies anxiety, depression, SI/HI.  No other specific complaints in a complete review of systems (except as listed in HPI above).  PE:  BP 136/90 mmHg  Pulse 70  Temp(Src) 98.7 F (37.1 C) (Oral)  Ht 5\' 6"  (1.676 m)  Wt 166 lb 8 oz (75.524 kg)  BMI 26.89 kg/m2  SpO2  98%  LMP 08/05/2015 Wt Readings from Last 3 Encounters:  08/10/15 166 lb 8 oz (75.524 kg)  12/09/14 167 lb (75.751 kg)  11/19/13 159 lb (72.122 kg)    General: Appears her stated age, well developed, well nourished in NAD. HEENT: Head: normal shape and size; Eyes: sclera white, no icterus, conjunctiva pink, PERRLA and EOMs intact; Ears: Tm's gray and intact, normal light reflex;Throat/Mouth: Teeth present, mucosa pink and moist, no lesions or ulcerations noted.  Cardiovascular: Normal rate and rhythm. S1,S2 noted.  No murmur, rubs or gallops noted.  Pulmonary/Chest: Normal  effort and positive vesicular breath sounds. No respiratory distress. No wheezes, rales or ronchi noted.  Abdomen: Soft and nontender. Normal bowel sounds. No distention or masses noted.  Neurological: Alert and oriented.  Psychiatric: Mood and affect normal. Behavior is normal. Judgment and thought content normal.     Assessment and Plan:  Anemia:  No obvious blood loss Will check CBC with diff, IBC panel and ferritin Will check B12 and Folate  Environmental Allergies:  Continue Zyrtec and Flonase  GERD with history of esophageal stricture:  No difficulty swallowing Continue Prilosec  RTC in 1 year for annual exam

## 2015-08-11 LAB — CBC WITH DIFFERENTIAL/PLATELET
Basophils Absolute: 0 10*3/uL (ref 0.0–0.1)
Basophils Relative: 0.7 % (ref 0.0–3.0)
EOS PCT: 7.3 % — AB (ref 0.0–5.0)
Eosinophils Absolute: 0.4 10*3/uL (ref 0.0–0.7)
HCT: 32 % — ABNORMAL LOW (ref 36.0–46.0)
Hemoglobin: 10.5 g/dL — ABNORMAL LOW (ref 12.0–15.0)
LYMPHS ABS: 1.7 10*3/uL (ref 0.7–4.0)
Lymphocytes Relative: 28.7 % (ref 12.0–46.0)
MCHC: 32.7 g/dL (ref 30.0–36.0)
MONOS PCT: 6.4 % (ref 3.0–12.0)
Monocytes Absolute: 0.4 10*3/uL (ref 0.1–1.0)
NEUTROS PCT: 56.9 % (ref 43.0–77.0)
Neutro Abs: 3.4 10*3/uL (ref 1.4–7.7)
Platelets: 314 10*3/uL (ref 150.0–400.0)
RBC: 4.75 Mil/uL (ref 3.87–5.11)
RDW: 17.6 % — ABNORMAL HIGH (ref 11.5–15.5)
WBC: 6 10*3/uL (ref 4.0–10.5)

## 2015-08-11 LAB — FOLATE: FOLATE: 14.1 ng/mL (ref >5.9)

## 2015-08-11 LAB — IBC PANEL
Iron: 25 ug/dL — ABNORMAL LOW (ref 42–145)
Saturation Ratios: 4.7 % — ABNORMAL LOW (ref 20.0–50.0)
Transferrin: 376 mg/dL — ABNORMAL HIGH (ref 212.0–360.0)

## 2015-08-11 LAB — FERRITIN: FERRITIN: 4.4 ng/mL — AB (ref 10.0–291.0)

## 2015-08-11 LAB — VITAMIN B12: VITAMIN B 12: 190 pg/mL — AB (ref 211–911)

## 2015-11-18 ENCOUNTER — Other Ambulatory Visit: Payer: BC Managed Care – PPO

## 2015-11-20 ENCOUNTER — Other Ambulatory Visit: Payer: Self-pay | Admitting: Obstetrics & Gynecology

## 2015-11-20 DIAGNOSIS — Z3041 Encounter for surveillance of contraceptive pills: Secondary | ICD-10-CM

## 2015-11-23 ENCOUNTER — Other Ambulatory Visit (INDEPENDENT_AMBULATORY_CARE_PROVIDER_SITE_OTHER): Payer: BC Managed Care – PPO

## 2015-11-23 DIAGNOSIS — D509 Iron deficiency anemia, unspecified: Secondary | ICD-10-CM

## 2015-11-23 DIAGNOSIS — D519 Vitamin B12 deficiency anemia, unspecified: Secondary | ICD-10-CM

## 2015-11-23 LAB — CBC WITH DIFFERENTIAL/PLATELET
Basophils Absolute: 0 10*3/uL (ref 0.0–0.1)
Basophils Relative: 0.3 % (ref 0.0–3.0)
EOS ABS: 0.2 10*3/uL (ref 0.0–0.7)
Eosinophils Relative: 5.1 % — ABNORMAL HIGH (ref 0.0–5.0)
HCT: 33.1 % — ABNORMAL LOW (ref 36.0–46.0)
HEMOGLOBIN: 10.9 g/dL — AB (ref 12.0–15.0)
Lymphocytes Relative: 27.9 % (ref 12.0–46.0)
Lymphs Abs: 1.3 10*3/uL (ref 0.7–4.0)
MCHC: 33 g/dL (ref 30.0–36.0)
MCV: 70.7 fl — ABNORMAL LOW (ref 78.0–100.0)
MONO ABS: 0.3 10*3/uL (ref 0.1–1.0)
Monocytes Relative: 5.6 % (ref 3.0–12.0)
NEUTROS PCT: 61.1 % (ref 43.0–77.0)
Neutro Abs: 2.9 10*3/uL (ref 1.4–7.7)
Platelets: 255 10*3/uL (ref 150.0–400.0)
RBC: 4.68 Mil/uL (ref 3.87–5.11)
RDW: 20.8 % — AB (ref 11.5–15.5)
WBC: 4.8 10*3/uL (ref 4.0–10.5)

## 2015-11-23 LAB — FERRITIN: FERRITIN: 7.4 ng/mL — AB (ref 10.0–291.0)

## 2015-11-23 LAB — VITAMIN B12: Vitamin B-12: 313 pg/mL (ref 211–911)

## 2015-11-23 LAB — FOLATE: FOLATE: 13 ng/mL (ref >5.9)

## 2015-11-23 LAB — IBC PANEL
Iron: 25 ug/dL — ABNORMAL LOW (ref 42–145)
SATURATION RATIOS: 5.3 % — AB (ref 20.0–50.0)
Transferrin: 339 mg/dL (ref 212.0–360.0)

## 2015-12-04 MED ORDER — NORGESTREL-ETHINYL ESTRADIOL 0.3-30 MG-MCG PO TABS: 1.0000 | ORAL_TABLET | Freq: Every day | ORAL | 2 refills | Status: DC

## 2015-12-04 NOTE — Telephone Encounter (Signed)
Refills sent to pharmacy. Pt notified.

## 2015-12-04 NOTE — Telephone Encounter (Signed)
-----   Message from Francia Greaves sent at 12/04/2015 10:07 AM EDT ----- Regarding: Rx Refill Contact: [phone] Needs refill on OCP Uses [facility]

## 2015-12-11 ENCOUNTER — Ambulatory Visit (INDEPENDENT_AMBULATORY_CARE_PROVIDER_SITE_OTHER): Payer: BC Managed Care – PPO | Admitting: Obstetrics & Gynecology

## 2015-12-11 ENCOUNTER — Encounter: Payer: Self-pay | Admitting: Obstetrics & Gynecology

## 2015-12-11 VITALS — BP 133/89 | HR 75 | Resp 18 | Ht 65.5 in | Wt 171.0 lb

## 2015-12-11 DIAGNOSIS — Z1151 Encounter for screening for human papillomavirus (HPV): Secondary | ICD-10-CM

## 2015-12-11 DIAGNOSIS — Z3041 Encounter for surveillance of contraceptive pills: Secondary | ICD-10-CM

## 2015-12-11 DIAGNOSIS — Z124 Encounter for screening for malignant neoplasm of cervix: Secondary | ICD-10-CM | POA: Diagnosis not present

## 2015-12-11 DIAGNOSIS — Z01419 Encounter for gynecological examination (general) (routine) without abnormal findings: Secondary | ICD-10-CM

## 2015-12-11 MED ORDER — NORGESTREL-ETHINYL ESTRADIOL 0.3-30 MG-MCG PO TABS: 1.0000 | ORAL_TABLET | Freq: Every day | ORAL | 12 refills | Status: DC

## 2015-12-11 NOTE — Progress Notes (Signed)
Subjective:    Mikayla Snow is a 40 y.o. MW P2 (36 and 58 yo kids)  female who presents for an annual exam. The patient has no complaints today. The patient is sexually active. GYN screening history: last pap: was normal. The patient wears seatbelts: yes. The patient participates in regular exercise: yes. Has the patient ever been transfused or tattooed?: no. The patient reports that there is not domestic violence in her life.   Menstrual History: OB History    Gravida Para Term Preterm AB Living   2 2 2  0 0 2   SAB TAB Ectopic Multiple Live Births   0 0 0 0 2      Menarche age: 30 Patient's last menstrual period was 11/25/2015.    The following portions of the patient's history were reviewed and updated as appropriate: allergies, current medications, past family history, past medical history, past social history, past surgical history and problem list.  Review of Systems Pertinent items are noted in HPI.  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    Objective:    BP (!) 145/87 (BP Location: Left Arm, Patient Position: Sitting, Cuff Size: Normal)   Pulse 80   Resp 18   Ht 5' 5.5" (1.664 m)   Wt 171 lb (77.6 kg)   LMP 11/25/2015   BMI 28.02 kg/m   General Appearance:    Alert, cooperative, no distress, appears stated age  Head:    Normocephalic, without obvious abnormality, atraumatic  Eyes:    PERRL, conjunctiva/corneas clear, EOM's intact, fundi    benign, both eyes  Ears:    Normal TM's and external ear canals, both ears  Nose:   Nares normal, septum midline, mucosa normal, no drainage    or sinus tenderness  Throat:   Lips, mucosa, and tongue normal; teeth and gums normal  Neck:   Supple, symmetrical, trachea midline, no adenopathy;    thyroid:  no enlargement/tenderness/nodules; no carotid   bruit or JVD  Back:     Symmetric, no curvature, ROM normal, no CVA tenderness  Lungs:     Clear to auscultation bilaterally, respirations unlabored  Chest Wall:     No tenderness or deformity   Heart:    Regular rate and rhythm, S1 and S2 normal, no murmur, rub   or gallop  Breast Exam:    No tenderness, masses, or nipple abnormality  Abdomen:     Soft, non-tender, bowel sounds active all four quadrants,    no masses, no organomegaly  Genitalia:    Normal female without lesion, discharge or tenderness     Extremities:   Extremities normal, atraumatic, no cyanosis or edema  Pulses:   2+ and symmetric all extremities  Skin:   Skin color, texture, turgor normal, no rashes or lesions  Lymph nodes:   Cervical, supraclavicular, and axillary nodes normal  Neurologic:   CNII-XII intact, normal strength, sensation and reflexes    throughout   .    Assessment:    Healthy female exam.    Plan:     Thin prep Pap smear. with cotesting (aware of ACOG recs) mammogram

## 2015-12-15 LAB — CYTOLOGY - PAP

## 2017-01-04 ENCOUNTER — Other Ambulatory Visit: Payer: Self-pay | Admitting: Obstetrics & Gynecology

## 2017-01-04 DIAGNOSIS — Z3041 Encounter for surveillance of contraceptive pills: Secondary | ICD-10-CM

## 2017-01-08 NOTE — Progress Notes (Deleted)
Normal MM 12/16/14 Normal pap 12/11/15

## 2017-01-09 ENCOUNTER — Ambulatory Visit: Payer: BC Managed Care – PPO | Admitting: Family Medicine

## 2017-02-02 ENCOUNTER — Telehealth: Payer: Self-pay | Admitting: Internal Medicine

## 2017-02-02 ENCOUNTER — Other Ambulatory Visit: Payer: Self-pay | Admitting: Obstetrics & Gynecology

## 2017-02-02 ENCOUNTER — Ambulatory Visit (INDEPENDENT_AMBULATORY_CARE_PROVIDER_SITE_OTHER): Payer: BC Managed Care – PPO | Admitting: Obstetrics & Gynecology

## 2017-02-02 ENCOUNTER — Encounter: Payer: Self-pay | Admitting: Radiology

## 2017-02-02 ENCOUNTER — Encounter: Payer: Self-pay | Admitting: Obstetrics & Gynecology

## 2017-02-02 VITALS — BP 168/91 | HR 89 | Ht 65.5 in | Wt 172.4 lb

## 2017-02-02 DIAGNOSIS — Z1231 Encounter for screening mammogram for malignant neoplasm of breast: Secondary | ICD-10-CM

## 2017-02-02 DIAGNOSIS — Z1151 Encounter for screening for human papillomavirus (HPV): Secondary | ICD-10-CM | POA: Diagnosis not present

## 2017-02-02 DIAGNOSIS — Z124 Encounter for screening for malignant neoplasm of cervix: Secondary | ICD-10-CM

## 2017-02-02 DIAGNOSIS — Z01419 Encounter for gynecological examination (general) (routine) without abnormal findings: Secondary | ICD-10-CM | POA: Diagnosis not present

## 2017-02-02 NOTE — Telephone Encounter (Signed)
Patient Name: Mikayla Snow  DOB: 1976-04-10    Initial Comment 10:07am - Caller has BP 160/98, no other sx. (office advised to be seen today, but she cannot due to other appts)   Nurse Assessment  Nurse: Raphael Gibney, RN, [name] Date/Time (Eastern Time): 02/02/2017 11:39:18 AM  Confirm and document reason for call. If symptomatic, describe symptoms. ---Caller states her BP is 160/98. Has appt at 2 pm tomorrow. Does not take BP medication.  Does the patient have any new or worsening symptoms? ---Yes  Will a triage be completed? ---Yes  Related visit to physician within the last 2 weeks? ---No  Does the PT have any chronic conditions? (i.e. diabetes, asthma, etc.) ---No  Is the patient pregnant or possibly pregnant? (Ask all females between the ages of 3-55) ---No  Is this a behavioral health or substance abuse call? ---No     Guidelines    Guideline Title Affirmed Question Affirmed Notes  High Blood Pressure Systolic BP >= 196 OR Diastolic >= 222    Final Disposition User   See PCP When Office is Open (within 3 days) Raphael Gibney, Therapist, sports, [place]    Referrals  REFERRED TO PCP OFFICE   Caller Disagree/Comply Comply  Caller Understands Yes  PreDisposition Call Doctor

## 2017-02-02 NOTE — Telephone Encounter (Signed)
Pt has appt on 02/03/17 at 2pm with Avie Echevaria NP.

## 2017-02-02 NOTE — Progress Notes (Signed)
Subjective:    Mikayla Snow is a 41 y.o. MW P57female who presents for an annual exam. The patient has no complaints today. The patient is sexually active. GYN screening history: last pap: was normal. The patient wears seatbelts: yes. The patient participates in regular exercise: no. Has the patient ever been transfused or tattooed?: no. The patient reports that there is not domestic violence in her life.   Menstrual History: OB History    Gravida Para Term Preterm AB Living   2 2 2  0 0 2   SAB TAB Ectopic Multiple Live Births   0 0 0 0 2      Menarche age: 32 Patient's last menstrual period was 01/18/2017.    The following portions of the patient's history were reviewed and updated as appropriate: allergies, current medications, past family history, past medical history, past social history, past surgical history and problem list.  Review of Systems Pertinent items are noted in HPI.   Married for 19 years Denies dyspareunia Happy with OCPs FH- no breast/gyn/colon cancer + prostate cancer in father Art therapist in [place]   Objective:    BP (!) 168/91   Pulse 89   Ht 5' 5.5" (1.664 m)   Wt 172 lb 6.4 oz (78.2 kg)   LMP 01/18/2017   BMI 28.25 kg/m   General Appearance:    Alert, cooperative, no distress, appears stated age  Head:    Normocephalic, without obvious abnormality, atraumatic  Eyes:    PERRL, conjunctiva/corneas clear, EOM's intact, fundi    benign, both eyes  Ears:    Normal TM's and external ear canals, both ears  Nose:   Nares normal, septum midline, mucosa normal, no drainage    or sinus tenderness  Throat:   Lips, mucosa, and tongue normal; teeth and gums normal  Neck:   Supple, symmetrical, trachea midline, no adenopathy;    thyroid:  no enlargement/tenderness/nodules; no carotid   bruit or JVD  Back:     Symmetric, no curvature, ROM normal, no CVA tenderness  Lungs:     Clear to auscultation bilaterally, respirations unlabored   Chest Wall:    No tenderness or deformity   Heart:    Regular rate and rhythm, S1 and S2 normal, no murmur, rub   or gallop  Breast Exam:    No tenderness, masses, or nipple abnormality  Abdomen:     Soft, non-tender, bowel sounds active all four quadrants,    no masses, no organomegaly  Genitalia:    Normal female without lesion, discharge or tenderness, moderate rectocele and cystocele, NSSA, NT, no adnexal masses, wide pubic arch     Extremities:   Extremities normal, atraumatic, no cyanosis or edema  Pulses:   2+ and symmetric all extremities  Skin:   Skin color, texture, turgor normal, no rashes or lesions  Lymph nodes:   Cervical, supraclavicular, and axillary nodes normal  Neurologic:   CNII-XII intact, normal strength, sensation and reflexes    throughout  .    Assessment:    Healthy female exam.    Plan:     Thin prep Pap smear. with cotesting Mammogram She plans to get fasting labs with her primary care MD She is calling her FP right now to discuss her elevated BP and make an appt Weight loss encouraged

## 2017-02-03 ENCOUNTER — Ambulatory Visit (INDEPENDENT_AMBULATORY_CARE_PROVIDER_SITE_OTHER): Payer: BC Managed Care – PPO | Admitting: Internal Medicine

## 2017-02-03 ENCOUNTER — Encounter: Payer: Self-pay | Admitting: Internal Medicine

## 2017-02-03 VITALS — BP 140/88 | HR 69 | Temp 97.9°F | Wt 173.0 lb

## 2017-02-03 DIAGNOSIS — I10 Essential (primary) hypertension: Secondary | ICD-10-CM | POA: Diagnosis not present

## 2017-02-03 HISTORY — DX: Essential (primary) hypertension: I10

## 2017-02-03 LAB — CYTOLOGY - PAP
Diagnosis: NEGATIVE
HPV: NOT DETECTED

## 2017-02-03 NOTE — Patient Instructions (Signed)
DASH Eating Plan DASH stands for "Dietary Approaches to Stop Hypertension." The DASH eating plan is a healthy eating plan that has been shown to reduce high blood pressure (hypertension). It may also reduce your risk for type 2 diabetes, heart disease, and stroke. The DASH eating plan may also help with weight loss. What are tips for following this plan? General guidelines  Avoid eating more than 2,300 mg (milligrams) of salt (sodium) a day. If you have hypertension, you may need to reduce your sodium intake to 1,500 mg a day.  Limit alcohol intake to no more than 1 drink a day for nonpregnant women and 2 drinks a day for men. One drink equals 12 oz of beer, 5 oz of wine, or 1 oz of hard liquor.  Work with your health care provider to maintain a healthy body weight or to lose weight. Ask what an ideal weight is for you.  Get at least 30 minutes of exercise that causes your heart to beat faster (aerobic exercise) most days of the week. Activities may include walking, swimming, or biking.  Work with your health care provider or diet and nutrition specialist (dietitian) to adjust your eating plan to your individual calorie needs. Reading food labels  Check food labels for the amount of sodium per serving. Choose foods with less than 5 percent of the Daily Value of sodium. Generally, foods with less than 300 mg of sodium per serving fit into this eating plan.  To find whole grains, look for the word "whole" as the first word in the ingredient list. Shopping  Buy products labeled as "low-sodium" or "no salt added."  Buy fresh foods. Avoid canned foods and premade or frozen meals. Cooking  Avoid adding salt when cooking. Use salt-free seasonings or herbs instead of table salt or sea salt. Check with your health care provider or pharmacist before using salt substitutes.  Do not fry foods. Cook foods using healthy methods such as baking, boiling, grilling, and broiling instead.  Cook with  heart-healthy oils, such as olive, canola, soybean, or sunflower oil. Meal planning   Eat a balanced diet that includes: ? 5 or more servings of fruits and vegetables each day. At each meal, try to fill half of your plate with fruits and vegetables. ? Up to 6-8 servings of whole grains each day. ? Less than 6 oz of lean meat, poultry, or fish each day. A 3-oz serving of meat is about the same size as a deck of cards. One egg equals 1 oz. ? 2 servings of low-fat dairy each day. ? A serving of nuts, seeds, or beans 5 times each week. ? Heart-healthy fats. Healthy fats called Omega-3 fatty acids are found in foods such as flaxseeds and coldwater fish, like sardines, salmon, and mackerel.  Limit how much you eat of the following: ? Canned or prepackaged foods. ? Food that is high in trans fat, such as fried foods. ? Food that is high in saturated fat, such as fatty meat. ? Sweets, desserts, sugary drinks, and other foods with added sugar. ? Full-fat dairy products.  Do not salt foods before eating.  Try to eat at least 2 vegetarian meals each week.  Eat more home-cooked food and less restaurant, buffet, and fast food.  When eating at a restaurant, ask that your food be prepared with less salt or no salt, if possible. What foods are recommended? The items listed may not be a complete list. Talk with your dietitian about what   dietary choices are best for you. Grains Whole-grain or whole-wheat bread. Whole-grain or whole-wheat pasta. Brown rice. Oatmeal. Quinoa. Bulgur. Whole-grain and low-sodium cereals. Pita bread. Low-fat, low-sodium crackers. Whole-wheat flour tortillas. Vegetables Fresh or frozen vegetables (raw, steamed, roasted, or grilled). Low-sodium or reduced-sodium tomato and vegetable juice. Low-sodium or reduced-sodium tomato sauce and tomato paste. Low-sodium or reduced-sodium canned vegetables. Fruits All fresh, dried, or frozen fruit. Canned fruit in natural juice (without  added sugar). Meat and other protein foods Skinless chicken or turkey. Ground chicken or turkey. Pork with fat trimmed off. Fish and seafood. Egg whites. Dried beans, peas, or lentils. Unsalted nuts, nut butters, and seeds. Unsalted canned beans. Lean cuts of beef with fat trimmed off. Low-sodium, lean deli meat. Dairy Low-fat (1%) or fat-free (skim) milk. Fat-free, low-fat, or reduced-fat cheeses. Nonfat, low-sodium ricotta or cottage cheese. Low-fat or nonfat yogurt. Low-fat, low-sodium cheese. Fats and oils Soft margarine without trans fats. Vegetable oil. Low-fat, reduced-fat, or light mayonnaise and salad dressings (reduced-sodium). Canola, safflower, olive, soybean, and sunflower oils. Avocado. Seasoning and other foods Herbs. Spices. Seasoning mixes without salt. Unsalted popcorn and pretzels. Fat-free sweets. What foods are not recommended? The items listed may not be a complete list. Talk with your dietitian about what dietary choices are best for you. Grains Baked goods made with fat, such as croissants, muffins, or some breads. Dry pasta or rice meal packs. Vegetables Creamed or fried vegetables. Vegetables in a cheese sauce. Regular canned vegetables (not low-sodium or reduced-sodium). Regular canned tomato sauce and paste (not low-sodium or reduced-sodium). Regular tomato and vegetable juice (not low-sodium or reduced-sodium). Pickles. Olives. Fruits Canned fruit in a light or heavy syrup. Fried fruit. Fruit in cream or butter sauce. Meat and other protein foods Fatty cuts of meat. Ribs. Fried meat. Bacon. Sausage. Bologna and other processed lunch meats. Salami. Fatback. Hotdogs. Bratwurst. Salted nuts and seeds. Canned beans with added salt. Canned or smoked fish. Whole eggs or egg yolks. Chicken or turkey with skin. Dairy Whole or 2% milk, cream, and half-and-half. Whole or full-fat cream cheese. Whole-fat or sweetened yogurt. Full-fat cheese. Nondairy creamers. Whipped toppings.  Processed cheese and cheese spreads. Fats and oils Butter. Stick margarine. Lard. Shortening. Ghee. Bacon fat. Tropical oils, such as coconut, palm kernel, or palm oil. Seasoning and other foods Salted popcorn and pretzels. Onion salt, garlic salt, seasoned salt, table salt, and sea salt. Worcestershire sauce. Tartar sauce. Barbecue sauce. Teriyaki sauce. Soy sauce, including reduced-sodium. Steak sauce. Canned and packaged gravies. Fish sauce. Oyster sauce. Cocktail sauce. Horseradish that you find on the shelf. Ketchup. Mustard. Meat flavorings and tenderizers. Bouillon cubes. Hot sauce and Tabasco sauce. Premade or packaged marinades. Premade or packaged taco seasonings. Relishes. Regular salad dressings. Where to find more information:  National Heart, Lung, and Blood Institute: www.nhlbi.nih.gov  American Heart Association: www.heart.org Summary  The DASH eating plan is a healthy eating plan that has been shown to reduce high blood pressure (hypertension). It may also reduce your risk for type 2 diabetes, heart disease, and stroke.  With the DASH eating plan, you should limit salt (sodium) intake to 2,300 mg a day. If you have hypertension, you may need to reduce your sodium intake to 1,500 mg a day.  When on the DASH eating plan, aim to eat more fresh fruits and vegetables, whole grains, lean proteins, low-fat dairy, and heart-healthy fats.  Work with your health care provider or diet and nutrition specialist (dietitian) to adjust your eating plan to your individual   calorie needs. This information is not intended to replace advice given to you by your health care provider. Make sure you discuss any questions you have with your health care provider. Document Released: 04/07/2011 Document Revised: 04/11/2016 Document Reviewed: 04/11/2016 Elsevier Interactive Patient Education  2017 Elsevier Inc.  

## 2017-02-03 NOTE — Progress Notes (Signed)
Subjective:    Patient ID: Mikayla Snow, female    DOB: 1975-09-01, 41 y.o.   MRN: [id number]  HPI  Pt presents to the clinic today with c/o elevated blood pressure. She reports she went to see GYN yesterday. Her BP at that time was 160/98. She reports she has never been told she has a history of HTN, but she has a strong family history of it. Her BP today is 140/88. She denies headaches, dizziness, blurred vision, chest pain or shortness of breath.   Review of Systems      Past Medical History:  Diagnosis Date  . AMA (advanced maternal age) multigravida 39+   . Esophageal stricture   . GERD (gastroesophageal reflux disease)   . History of chickenpox   . History of hay fever   . History of urinary incontinence     Current Outpatient Prescriptions  Medication Sig Dispense Refill  . Cetirizine HCl (ZYRTEC PO) Take by mouth.      . CRYSELLE-28 0.3-30 MG-MCG tablet TAKE 1 TABLET BY MOUTH DAILY. 28 tablet 8  . omeprazole (PRILOSEC) 20 MG capsule TAKE ONE CAPSULE BY MOUTH EVERY DAY 30 MINUTES BEFORE MEAL 30 capsule 3  . ferrous sulfate 325 (65 FE) MG tablet Take 325 mg by mouth daily with breakfast.    . vitamin B-12 (CYANOCOBALAMIN) 500 MCG tablet Take 500 mcg by mouth daily.     No current facility-administered medications for this visit.     No Known Allergies  Family History  Problem Relation Age of Onset  . Hypertension Mother   . Depression Mother   . Arthritis Paternal Grandmother        severe in hands  . Stroke Paternal Grandfather   . Hypertension Father     Social History   Social History  . Marital status: Married    Spouse name: N/A  . Number of children: N/A  . Years of education: N/A   Occupational History  . Not on file.   Social History Main Topics  . Smoking status: Never Smoker  . Smokeless tobacco: Never Used  . Alcohol use 0.0 oz/week     Comment: occ  . Drug use: No  . Sexual activity: Yes    Partners: Male    Birth control/  protection: Pill   Other Topics Concern  . Not on file   Social History Narrative  . No narrative on file     Constitutional: Denies fever, malaise, fatigue, headache or abrupt weight changes.  Respiratory: Denies difficulty breathing, shortness of breath, cough or sputum production.   Cardiovascular: Denies chest pain, chest tightness, palpitations or swelling in the hands or feet.  Neurological: Denies dizziness, difficulty with memory, difficulty with speech or problems with balance and coordination.    No other specific complaints in a complete review of systems (except as listed in HPI above).  Objective:   Physical Exam   BP 140/88   Pulse 69   Temp 97.9 F (36.6 C) (Oral)   Wt 173 lb (78.5 kg)   LMP 01/18/2017   SpO2 98%   BMI 28.35 kg/m  Wt Readings from Last 3 Encounters:  02/03/17 173 lb (78.5 kg)  02/02/17 172 lb 6.4 oz (78.2 kg)  12/11/15 171 lb (77.6 kg)    General: Appears her stated age, in NAD. Cardiovascular: Normal rate and rhythm. S1,S2 noted.  No murmur, rubs or gallops noted. Pulmonary/Chest: Normal effort and positive vesicular breath sounds. No respiratory distress. No  wheezes, rales or ronchi noted.  Neurological: Alert and oriented.  Psychiatric: Mood and affect normal. Behavior is normal. Judgment and thought content normal.     Assessment & Plan:

## 2017-02-03 NOTE — Assessment & Plan Note (Signed)
She is not interested in medication management at this time Discussed exercise for weight loss Discussed DASH diet  RTC in 3 months for annual exam, will discuss treatment options at that time if no improvement

## 2017-02-27 ENCOUNTER — Ambulatory Visit
Admission: RE | Admit: 2017-02-27 | Discharge: 2017-02-27 | Disposition: A | Discharge status: Home / Self Care | Payer: BC Managed Care – PPO | Source: Ambulatory Visit | Attending: Obstetrics & Gynecology | Admitting: Obstetrics & Gynecology

## 2017-02-27 DIAGNOSIS — Z1231 Encounter for screening mammogram for malignant neoplasm of breast: Secondary | ICD-10-CM

## 2017-05-16 ENCOUNTER — Encounter: Payer: Self-pay | Admitting: Internal Medicine

## 2017-05-16 ENCOUNTER — Ambulatory Visit (INDEPENDENT_AMBULATORY_CARE_PROVIDER_SITE_OTHER): Payer: BC Managed Care – PPO | Admitting: Internal Medicine

## 2017-05-16 VITALS — BP 144/90 | HR 76 | Temp 97.9°F | Ht 66.0 in | Wt 173.0 lb

## 2017-05-16 DIAGNOSIS — K219 Gastro-esophageal reflux disease without esophagitis: Secondary | ICD-10-CM | POA: Diagnosis not present

## 2017-05-16 DIAGNOSIS — Z0000 Encounter for general adult medical examination without abnormal findings: Secondary | ICD-10-CM | POA: Diagnosis not present

## 2017-05-16 DIAGNOSIS — Z13220 Encounter for screening for lipoid disorders: Secondary | ICD-10-CM | POA: Diagnosis not present

## 2017-05-16 DIAGNOSIS — I10 Essential (primary) hypertension: Secondary | ICD-10-CM

## 2017-05-16 DIAGNOSIS — E559 Vitamin D deficiency, unspecified: Secondary | ICD-10-CM

## 2017-05-16 LAB — CBC
HCT: 38.7 % (ref 36.0–46.0)
Hemoglobin: 12.7 g/dL (ref 12.0–15.0)
MCHC: 32.9 g/dL (ref 30.0–36.0)
MCV: 81.7 fl (ref 78.0–100.0)
Platelets: 247 10*3/uL (ref 150.0–400.0)
RBC: 4.74 Mil/uL (ref 3.87–5.11)
RDW: 14.5 % (ref 11.5–15.5)
WBC: 4.5 10*3/uL (ref 4.0–10.5)

## 2017-05-16 LAB — COMPREHENSIVE METABOLIC PANEL
ALK PHOS: 43 U/L (ref 39–117)
ALT: 12 U/L (ref 0–35)
AST: 15 U/L (ref 0–37)
Albumin: 4.2 g/dL (ref 3.5–5.2)
BILIRUBIN TOTAL: 0.5 mg/dL (ref 0.2–1.2)
BUN: 9 mg/dL (ref 6–23)
CO2: 30 mEq/L (ref 19–32)
Calcium: 9 mg/dL (ref 8.4–10.5)
Chloride: 102 mEq/L (ref 96–112)
Creatinine, Ser: 0.77 mg/dL (ref 0.40–1.20)
GFR: 86.68 mL/min (ref >60.00)
Glucose, Bld: 91 mg/dL (ref 70–99)
Potassium: 3.7 mEq/L (ref 3.5–5.1)
SODIUM: 138 meq/L (ref 135–145)
TOTAL PROTEIN: 7.3 g/dL (ref 6.0–8.3)

## 2017-05-16 LAB — LIPID PANEL
Cholesterol: 204 mg/dL — ABNORMAL HIGH (ref 0–200)
HDL: 43.4 mg/dL (ref >39.00)
LDL Cholesterol: 140 mg/dL — ABNORMAL HIGH (ref 0–99)
NONHDL: 160.81
Total CHOL/HDL Ratio: 5
Triglycerides: 104 mg/dL (ref 0.0–149.0)
VLDL: 20.8 mg/dL (ref 0.0–40.0)

## 2017-05-16 LAB — VITAMIN D 25 HYDROXY (VIT D DEFICIENCY, FRACTURES): VITD: 26.3 ng/mL — AB (ref 30.00–100.00)

## 2017-05-16 MED ORDER — LISINOPRIL 10 MG PO TABS: 10.0000 mg | ORAL_TABLET | Freq: Every day | ORAL | 0 refills | Status: DC

## 2017-05-16 NOTE — Assessment & Plan Note (Signed)
Continue Omeprazole CBC and CMET today 

## 2017-05-16 NOTE — Assessment & Plan Note (Signed)
Discussed DASH diet and exercise Start Lisinopril 10 mg daily Have RN at work monitor BP weekly, send me the readings via mychart in 3 weeks CBC and CMET today

## 2017-05-16 NOTE — Progress Notes (Signed)
Subjective:    Patient ID: Mikayla Snow, female    DOB: 04-10-1976, 42 y.o.   MRN: [id number]  HPI  Pt presents to the clinic today for her annual exam. Of note, her blood pressures remained elevated. It is 144/90. She has not been working that hard on [employer] or exercise for weight loss. She does have a family history of HTN. She is ready to start medication at this time.  GERD: She is not sure what triggers this. She takes Omeprazole and denies breakthrough symptoms.   Flu: 01/2017 Tetanus: 2012 Pap Smear: 01/2017 Mammogram: 01/2017 Vision Screening: annually Dentist: biannually  Diet: She does eat meat. She consumes some fruits and veggies. She occasionally eats fried foods. She drinks mostly water, coffee, soda. Exercise: She is getting 10000 steps in daily, walking on the treadmill a few days per week.  Review of Systems      Past Medical History:  Diagnosis Date  . AMA (advanced maternal age) multigravida 56+   . Esophageal stricture   . GERD (gastroesophageal reflux disease)   . History of chickenpox   . History of hay fever   . History of urinary incontinence     Current Outpatient Medications  Medication Sig Dispense Refill  . Cetirizine HCl (ZYRTEC PO) Take by mouth.      . CRYSELLE-28 0.3-30 MG-MCG tablet TAKE 1 TABLET BY MOUTH DAILY. 28 tablet 8  . ferrous sulfate 325 (65 FE) MG tablet Take 325 mg by mouth daily with breakfast.    . omeprazole (PRILOSEC) 20 MG capsule TAKE ONE CAPSULE BY MOUTH EVERY DAY 30 MINUTES BEFORE MEAL 30 capsule 3  . vitamin B-12 (CYANOCOBALAMIN) 500 MCG tablet Take 500 mcg by mouth daily.     No current facility-administered medications for this visit.     No Known Allergies  Family History  Problem Relation Age of Onset  . Hypertension Mother   . Depression Mother   . Arthritis Paternal Grandmother        severe in hands  . Stroke Paternal Grandfather   . Hypertension Father     Social History   Socioeconomic  History  . Marital status: Married    Spouse name: Not on file  . Number of children: Not on file  . Years of education: Not on file  . Highest education level: Not on file  Social Needs  . Financial resource strain: Not on file  . Food insecurity - worry: Not on file  . Food insecurity - inability: Not on file  . Transportation needs - medical: Not on file  . Transportation needs - non-medical: Not on file  Occupational History  . Not on file  Tobacco Use  . Smoking status: Never Smoker  . Smokeless tobacco: Never Used  Substance and Sexual Activity  . Alcohol use: Yes    Alcohol/week: 0.0 oz    Comment: occ  . Drug use: No  . Sexual activity: Yes    Partners: Male    Birth control/protection: Pill  Other Topics Concern  . Not on file  Social History Narrative  . Not on file     Constitutional: Pt reports intermittent headaches. Denies fever, malaise, fatigue, or abrupt weight changes.  HEENT: Denies eye pain, eye redness, ear pain, ringing in the ears, wax buildup, runny nose, nasal congestion, bloody nose, or sore throat. Respiratory: Denies difficulty breathing, shortness of breath, cough or sputum production.   Cardiovascular: Denies chest pain, chest tightness, palpitations or  swelling in the hands or feet.  Gastrointestinal: Pt reports intermittent reflux. Denies abdominal pain, bloating, constipation, diarrhea or blood in the stool.  GU: Denies urgency, frequency, pain with urination, burning sensation, blood in urine, odor or discharge. Musculoskeletal: Denies decrease in range of motion, difficulty with gait, muscle pain or joint pain and swelling.  Skin: Denies redness, rashes, lesions or ulcercations.  Neurological: Denies dizziness, difficulty with memory, difficulty with speech or problems with balance and coordination.  Psych: Denies anxiety, depression, SI/HI.  No other specific complaints in a complete review of systems (except as listed in HPI  above).  Objective:   Physical Exam  BP (!) 144/90   Pulse 76   Temp 97.9 F (36.6 C) (Oral)   Ht 5\' 6"  (1.676 m)   Wt 173 lb (78.5 kg)   LMP 05/09/2017   SpO2 99%   BMI 27.92 kg/m  Wt Readings from Last 3 Encounters:  05/16/17 173 lb (78.5 kg)  02/03/17 173 lb (78.5 kg)  02/02/17 172 lb 6.4 oz (78.2 kg)    General: Appears her stated age, well developed, well nourished in NAD. Skin: Warm, dry and intact.  HEENT: Head: normal shape and size; Eyes: sclera white, no icterus, conjunctiva pink, PERRLA and EOMs intact; Ears: Tm's gray and intact, normal light reflex; Throat/Mouth: Teeth present, mucosa pink and moist, no exudate, lesions or ulcerations noted.  Neck:  Neck supple, trachea midline. No masses, lumps or thyromegaly present.  Cardiovascular: Normal rate and rhythm. S1,S2 noted.  No murmur, rubs or gallops noted. No JVD or BLE edema.  Pulmonary/Chest: Normal effort and positive vesicular breath sounds. No respiratory distress. No wheezes, rales or ronchi noted.  Abdomen: Soft and nontender. Normal bowel sounds. No distention or masses noted. Liver, spleen and kidneys non palpable. Musculoskeletal: Strength 5/5 BUE/BLE. No difficulty with gait.  Neurological: Alert and oriented. Cranial nerves II-XII grossly intact. Coordination normal.  Psychiatric: Mood and affect normal. Behavior is normal. Judgment and thought content normal.    BMET No results found for: NA, K, CL, CO2, GLUCOSE, BUN, CREATININE, CALCIUM, GFRNONAA, GFRAA  Lipid Panel  No results found for: CHOL, TRIG, HDL, CHOLHDL, VLDL, LDLCALC  CBC    Component Value Date/Time   WBC 4.8 11/23/2015 0848   RBC 4.68 11/23/2015 0848   HGB 10.9 (L) 11/23/2015 0848   HCT 33.1 (L) 11/23/2015 0848   PLT 255.0 11/23/2015 0848   MCV 70.7 (L) 11/23/2015 0848   MCH 25.9 (L) 06/15/2011 2230   MCHC 33.0 11/23/2015 0848   RDW 20.8 (H) 11/23/2015 0848   LYMPHSABS 1.3 11/23/2015 0848   MONOABS 0.3 11/23/2015 0848    EOSABS 0.2 11/23/2015 0848   BASOSABS 0.0 11/23/2015 0848    Hgb A1C No results found for: HGBA1C          Assessment & Plan:   Preventative Health Maintenance:  Flu and tetanus UTD Pap smear and mammogram UTD Encouraged her to consume a balanced diet and exercise regimen Advised her to see an eye doctor and dentist annually Will check CBC, CMET, Lipid and Vit D today  RTC in 3 months, follow up HTN BAITY, REGINA, NP

## 2017-05-16 NOTE — Patient Instructions (Signed)

## 2017-06-06 ENCOUNTER — Encounter: Payer: Self-pay | Admitting: Internal Medicine

## 2017-06-07 MED ORDER — LISINOPRIL 10 MG PO TABS: 10.0000 mg | ORAL_TABLET | Freq: Every day | ORAL | 1 refills | Status: DC

## 2017-07-13 MED ORDER — LISINOPRIL 20 MG PO TABS: 20.0000 mg | ORAL_TABLET | Freq: Every day | ORAL | 1 refills | Status: DC

## 2017-07-13 NOTE — Addendum Note (Signed)
Addended by: Jearld Fenton on: 07/13/2017 10:56 AM   Modules accepted: Orders

## 2017-08-07 ENCOUNTER — Other Ambulatory Visit: Payer: Self-pay | Admitting: Internal Medicine

## 2017-08-13 ENCOUNTER — Encounter: Payer: Self-pay | Admitting: Internal Medicine

## 2017-08-14 ENCOUNTER — Other Ambulatory Visit: Payer: Self-pay | Admitting: Internal Medicine

## 2017-08-28 ENCOUNTER — Encounter: Payer: Self-pay | Admitting: Internal Medicine

## 2017-08-28 ENCOUNTER — Other Ambulatory Visit: Payer: Self-pay

## 2017-08-28 DIAGNOSIS — Z3041 Encounter for surveillance of contraceptive pills: Secondary | ICD-10-CM

## 2017-08-28 MED ORDER — NORGESTREL-ETHINYL ESTRADIOL 0.3-30 MG-MCG PO TABS: 1.0000 | ORAL_TABLET | Freq: Every day | ORAL | 8 refills | Status: DC

## 2017-08-31 ENCOUNTER — Other Ambulatory Visit: Payer: Self-pay

## 2017-08-31 MED ORDER — LISINOPRIL 20 MG PO TABS: 20.0000 mg | ORAL_TABLET | Freq: Every day | ORAL | 0 refills | Status: DC

## 2017-09-08 ENCOUNTER — Other Ambulatory Visit: Payer: Self-pay | Admitting: Internal Medicine

## 2017-11-08 ENCOUNTER — Encounter: Payer: Self-pay | Admitting: Internal Medicine

## 2017-11-08 DIAGNOSIS — B360 Pityriasis versicolor: Secondary | ICD-10-CM

## 2017-11-08 DIAGNOSIS — R238 Other skin changes: Principal | ICD-10-CM

## 2017-11-15 ENCOUNTER — Telehealth: Payer: Self-pay

## 2017-11-15 NOTE — Telephone Encounter (Signed)
Left message for patient to call Mikayla Snow back in regards to a referral-Mikayla Snow, RMA   

## 2017-12-20 ENCOUNTER — Other Ambulatory Visit: Payer: Self-pay | Admitting: Internal Medicine

## 2017-12-28 ENCOUNTER — Encounter: Payer: Self-pay | Admitting: Radiology

## 2018-01-15 ENCOUNTER — Encounter: Payer: Self-pay | Admitting: Internal Medicine

## 2018-01-15 MED ORDER — LISINOPRIL 20 MG PO TABS: 20.0000 mg | ORAL_TABLET | Freq: Every day | ORAL | 0 refills | Status: DC

## 2018-03-21 ENCOUNTER — Other Ambulatory Visit: Payer: Self-pay

## 2018-03-21 MED ORDER — LISINOPRIL 20 MG PO TABS: 20.0000 mg | ORAL_TABLET | Freq: Every day | ORAL | 0 refills | Status: DC

## 2018-03-23 ENCOUNTER — Other Ambulatory Visit: Payer: Self-pay | Admitting: Internal Medicine

## 2018-06-04 ENCOUNTER — Other Ambulatory Visit: Payer: Self-pay | Admitting: Obstetrics & Gynecology

## 2018-06-04 DIAGNOSIS — Z3041 Encounter for surveillance of contraceptive pills: Secondary | ICD-10-CM

## 2018-06-13 ENCOUNTER — Ambulatory Visit (INDEPENDENT_AMBULATORY_CARE_PROVIDER_SITE_OTHER): Payer: BC Managed Care – PPO | Admitting: Family Medicine

## 2018-06-13 VITALS — BP 124/77 | HR 74 | Wt 174.0 lb

## 2018-06-13 DIAGNOSIS — Z01419 Encounter for gynecological examination (general) (routine) without abnormal findings: Secondary | ICD-10-CM

## 2018-06-13 DIAGNOSIS — Z3041 Encounter for surveillance of contraceptive pills: Secondary | ICD-10-CM

## 2018-06-13 MED ORDER — NORGESTREL-ETHINYL ESTRADIOL 0.3-30 MG-MCG PO TABS: 1.0000 | ORAL_TABLET | Freq: Every day | ORAL | 3 refills | Status: DC

## 2018-06-13 NOTE — Progress Notes (Signed)
   GYNECOLOGY ANNUAL PREVENTATIVE CARE ENCOUNTER NOTE  Subjective:   Mikayla Snow is a 43 y.o. G9P2002 female here for a routine annual gynecologic exam.  Current complaints: none.   Denies abnormal vaginal bleeding, discharge, pelvic pain, problems with intercourse or other gynecologic concerns.    Gynecologic History No LMP recorded. Contraception: OCP (estrogen/progesterone) Last Pap: 2018. Results were: normal Last mammogram: 2018. Results were: normal  The following portions of the patient's history were reviewed and updated as appropriate: allergies, current medications, past family history, past medical history, past social history, past surgical history and problem list.  Review of Systems Pertinent items are noted in HPI.   Objective:  BP 124/77   Pulse 74   Wt 174 lb (78.9 kg)   BMI 28.08 kg/m  CONSTITUTIONAL: Well-developed, well-nourished female in no acute distress.  HENT:  Normocephalic, atraumatic, External right and left ear normal. Oropharynx is clear and moist EYES:  No scleral icterus.  NECK: Normal range of motion, supple, no masses.  Normal thyroid.  SKIN: Skin is warm and dry. No rash noted. Not diaphoretic. No erythema. No pallor. NEUROLOGIC: Alert and oriented to person, place, and time. Normal reflexes, muscle tone coordination. No cranial nerve deficit noted. PSYCHIATRIC: Normal mood and affect. Normal behavior. Normal judgment and thought content. CARDIOVASCULAR: Normal heart rate noted, regular rhythm. 2+ distal pulses. RESPIRATORY: Effort and breath sounds normal, no problems with respiration noted. BREASTS: Symmetric in size. No masses, skin changes, nipple drainage, or lymphadenopathy. ABDOMEN: Soft,  no distention noted.  No tenderness, rebound or guarding.  PELVIC: Normal appearing external genitalia- evidence of likely deep 2nd vs 3rd degree laceration; normal appearing vaginal mucosa and cervix.  No abnormal discharge noted.  Normal uterine  size, no other palpable masses, no uterine or adnexal tenderness. MUSCULOSKELETAL: Normal range of motion.    Assessment and Plan:   1. Encounter for surveillance of contraceptive pills-Contraception counseling: desires to continue OCP.  - norgestrel-ethinyl estradiol (CRYSELLE-28) 0.3-30 MG-MCG tablet; Take 1 tablet by mouth daily.  Dispense: 84 tablet; Refill: 3  2. Well woman exam with routine gynecological exam Annual gynecologic examination: next pap in 5 years but patient asked to be offered pap yearly with shared decision making at the time.  Routine preventative health maintenance measures emphasized. - Discussed pelvic floor exercises - MM 3D SCREEN BREAST BILATERAL; Future  Please refer to After Visit Summary for other counseling recommendations.   Return for Yearly wellness exam.  Caren Macadam, MD, MPH, ABFM Attending [place] for [facility]

## 2018-06-13 NOTE — Progress Notes (Signed)
Last pap 02/02/2017- normal  Mammogram 02/27/2017- normal

## 2018-06-13 NOTE — Patient Instructions (Signed)
Preventive Care 40-64 Years, Female Preventive care refers to lifestyle choices and visits with your health care provider that can promote health and wellness. What does preventive care include?   A yearly physical exam. This is also called an annual well check.  Dental exams once or twice a year.  Routine eye exams. Ask your health care provider how often you should have your eyes checked.  Personal lifestyle choices, including: ? Daily care of your teeth and gums. ? Regular physical activity. ? Eating a healthy diet. ? Avoiding tobacco and drug use. ? Limiting alcohol use. ? Practicing safe sex. ? Taking low-dose aspirin daily starting at age 50. ? Taking vitamin and mineral supplements as recommended by your health care provider. What happens during an annual well check? The services and screenings done by your health care provider during your annual well check will depend on your age, overall health, lifestyle risk factors, and family history of disease. Counseling Your health care provider may ask you questions about your:  Alcohol use.  Tobacco use.  Drug use.  Emotional well-being.  Home and relationship well-being.  Sexual activity.  Eating habits.  Work and work environment.  Method of birth control.  Menstrual cycle.  Pregnancy history. Screening You may have the following tests or measurements:  Height, weight, and BMI.  Blood pressure.  Lipid and cholesterol levels. These may be checked every 5 years, or more frequently if you are over 50 years old.  Skin check.  Lung cancer screening. You may have this screening every year starting at age 55 if you have a 30-pack-year history of smoking and currently smoke or have quit within the past 15 years.  Colorectal cancer screening. All adults should have this screening starting at age 50 and continuing until age 75. Your health care provider may recommend screening at age 45. You will have tests every  1-10 years, depending on your results and the type of screening test. People at increased risk should start screening at an earlier age. Screening tests may include: ? Guaiac-based fecal occult blood testing. ? Fecal immunochemical test (FIT). ? Stool DNA test. ? Virtual colonoscopy. ? Sigmoidoscopy. During this test, a flexible tube with a tiny camera (sigmoidoscope) is used to examine your rectum and lower colon. The sigmoidoscope is inserted through your anus into your rectum and lower colon. ? Colonoscopy. During this test, a long, thin, flexible tube with a tiny camera (colonoscope) is used to examine your entire colon and rectum.  Hepatitis C blood test.  Hepatitis B blood test.  Sexually transmitted disease (STD) testing.  Diabetes screening. This is done by checking your blood sugar (glucose) after you have not eaten for a while (fasting). You may have this done every 1-3 years.  Mammogram. This may be done every 1-2 years. Talk to your health care provider about when you should start having regular mammograms. This may depend on whether you have a family history of breast cancer.  BRCA-related cancer screening. This may be done if you have a family history of breast, ovarian, tubal, or peritoneal cancers.  Pelvic exam and Pap test. This may be done every 3 years starting at age 21. Starting at age 30, this may be done every 5 years if you have a Pap test in combination with an HPV test.  Bone density scan. This is done to screen for osteoporosis. You may have this scan if you are at high risk for osteoporosis. Discuss your test results, treatment options,   and if necessary, the need for more tests with your health care provider. Vaccines Your health care provider may recommend certain vaccines, such as:  Influenza vaccine. This is recommended every year.  Tetanus, diphtheria, and acellular pertussis (Tdap, Td) vaccine. You may need a Td booster every 10 years.  Varicella  vaccine. You may need this if you have not been vaccinated.  Zoster vaccine. You may need this after age 38.  Measles, mumps, and rubella (MMR) vaccine. You may need at least one dose of MMR if you were born in 1957 or later. You may also need a second dose.  Pneumococcal 13-valent conjugate (PCV13) vaccine. You may need this if you have certain conditions and were not previously vaccinated.  Pneumococcal polysaccharide (PPSV23) vaccine. You may need one or two doses if you smoke cigarettes or if you have certain conditions.  Meningococcal vaccine. You may need this if you have certain conditions.  Hepatitis A vaccine. You may need this if you have certain conditions or if you travel or work in places where you may be exposed to hepatitis A.  Hepatitis B vaccine. You may need this if you have certain conditions or if you travel or work in places where you may be exposed to hepatitis B.  Haemophilus influenzae type b (Hib) vaccine. You may need this if you have certain conditions. Talk to your health care provider about which screenings and vaccines you need and how often you need them. This information is not intended to replace advice given to you by your health care provider. Make sure you discuss any questions you have with your health care provider. Document Released: 05/15/2015 Document Revised: 06/08/2017 Document Reviewed: 02/17/2015 Elsevier Interactive Patient Education  2019 [employer].

## 2018-07-23 ENCOUNTER — Encounter: Payer: Self-pay | Admitting: Internal Medicine

## 2018-07-24 MED ORDER — LISINOPRIL 20 MG PO TABS: 20.0000 mg | ORAL_TABLET | Freq: Every day | ORAL | 0 refills | Status: DC

## 2018-07-25 ENCOUNTER — Telehealth (INDEPENDENT_AMBULATORY_CARE_PROVIDER_SITE_OTHER): Payer: BC Managed Care – PPO | Admitting: Internal Medicine

## 2018-07-25 ENCOUNTER — Other Ambulatory Visit: Payer: Self-pay

## 2018-07-25 DIAGNOSIS — I10 Essential (primary) hypertension: Secondary | ICD-10-CM

## 2018-07-25 DIAGNOSIS — K219 Gastro-esophageal reflux disease without esophagitis: Secondary | ICD-10-CM

## 2018-07-25 DIAGNOSIS — D508 Other iron deficiency anemias: Secondary | ICD-10-CM | POA: Diagnosis not present

## 2018-07-25 NOTE — Patient Instructions (Signed)

## 2018-07-25 NOTE — Progress Notes (Signed)
Virtual Visit via Telephone Note  I connected with Mikayla Snow on 07/25/18 at  3:15 PM EDT by telephone and verified that I am speaking with the correct person using two identifiers.   I discussed the limitations, risks, security and privacy concerns of performing an evaluation and management service by telephone and the availability of in person appointments. I also discussed with the patient that there may be a patient responsible charge related to this service. The patient expressed understanding and agreed to proceed.   History of Present Illness: Pt due for follow up of chronic conditions.  GERD: Hx of stricture. She denies breakthrough on Omeprazole. Last upper GI from 12/2009 reviewed.  HTN: She is taking Lisinopril as prescribed. She denies adverse side effects. There is no ECG on file.  Anemia: Her last H/H was 10.9/33.1, 05/2017. She is taking an iron supplement as prescribed. She denies constipation. She denies fatigue, cold intolerance or SOB.    Observations/Objective: Alert and oriented x 3.  NAD. Judgement and thought content are normal.    Assessment and Plan:  GERD:  Continue Omeprazole No indication for repeat upper GI at this time  HTN:  Continue Lisinopril Reinforced DASH diet and exercise for weight loss  Anemia:  Continue iron supplement Will check CBC, IBC panel at annual exam   Follow Up Instructions:    I discussed the assessment and treatment plan with the patient. The patient was provided an opportunity to ask questions and all were answered. The patient agreed with the plan and demonstrated an understanding of the instructions.   The patient was advised to call back or seek an in-person evaluation if the symptoms worsen or if the condition fails to improve as anticipated.  I provided 8 minutes of non-face-to-face time during this encounter.   Webb Silversmith, NP

## 2018-08-07 ENCOUNTER — Ambulatory Visit: Payer: BC Managed Care – PPO

## 2018-10-19 ENCOUNTER — Ambulatory Visit: Payer: BC Managed Care – PPO

## 2018-11-07 ENCOUNTER — Encounter: Payer: Self-pay | Admitting: Internal Medicine

## 2018-11-07 ENCOUNTER — Other Ambulatory Visit: Payer: Self-pay | Admitting: Internal Medicine

## 2018-11-07 MED ORDER — LISINOPRIL 20 MG PO TABS: 20.0000 mg | ORAL_TABLET | Freq: Every day | ORAL | 1 refills | Status: DC

## 2018-11-07 NOTE — Addendum Note (Signed)
Addended by: Kris Mouton on: 11/07/2018 09:10 AM   Modules accepted: Orders

## 2018-12-03 ENCOUNTER — Other Ambulatory Visit: Payer: Self-pay

## 2018-12-03 ENCOUNTER — Ambulatory Visit
Admission: RE | Admit: 2018-12-03 | Discharge: 2018-12-03 | Disposition: A | Discharge status: Home / Self Care | Payer: BC Managed Care – PPO | Source: Ambulatory Visit | Attending: Family Medicine | Admitting: Family Medicine

## 2018-12-03 DIAGNOSIS — Z01419 Encounter for gynecological examination (general) (routine) without abnormal findings: Secondary | ICD-10-CM

## 2019-04-10 ENCOUNTER — Encounter: Payer: Self-pay | Admitting: Radiology

## 2019-05-04 ENCOUNTER — Other Ambulatory Visit: Payer: Self-pay | Admitting: Internal Medicine

## 2019-06-05 ENCOUNTER — Other Ambulatory Visit: Payer: Self-pay | Admitting: Internal Medicine

## 2019-06-05 MED ORDER — LISINOPRIL 20 MG PO TABS: 20.0000 mg | ORAL_TABLET | Freq: Every day | ORAL | 0 refills | Status: DC

## 2019-06-08 ENCOUNTER — Other Ambulatory Visit: Payer: Self-pay | Admitting: Internal Medicine

## 2019-06-30 ENCOUNTER — Telehealth: Payer: BC Managed Care – PPO | Admitting: Emergency Medicine

## 2019-06-30 DIAGNOSIS — R300 Dysuria: Secondary | ICD-10-CM

## 2019-06-30 MED ORDER — CEPHALEXIN 500 MG PO CAPS: 500.0000 mg | ORAL_CAPSULE | Freq: Two times a day (BID) | ORAL | 0 refills | Status: AC

## 2019-06-30 NOTE — Progress Notes (Signed)

## 2019-07-08 ENCOUNTER — Other Ambulatory Visit: Payer: Self-pay

## 2019-07-08 ENCOUNTER — Ambulatory Visit (INDEPENDENT_AMBULATORY_CARE_PROVIDER_SITE_OTHER): Payer: BC Managed Care – PPO | Admitting: Adult Health

## 2019-07-08 ENCOUNTER — Encounter: Payer: Self-pay | Admitting: Adult Health

## 2019-07-08 ENCOUNTER — Other Ambulatory Visit (HOSPITAL_COMMUNITY)
Admission: RE | Admit: 2019-07-08 | Discharge: 2019-07-08 | Disposition: A | Discharge status: Home / Self Care | Payer: BC Managed Care – PPO | Source: Ambulatory Visit | Attending: Adult Health | Admitting: Adult Health

## 2019-07-08 VITALS — BP 127/86 | HR 85 | Ht 65.5 in | Wt 183.0 lb

## 2019-07-08 DIAGNOSIS — N3946 Mixed incontinence: Secondary | ICD-10-CM | POA: Insufficient documentation

## 2019-07-08 DIAGNOSIS — Z01419 Encounter for gynecological examination (general) (routine) without abnormal findings: Secondary | ICD-10-CM | POA: Diagnosis present

## 2019-07-08 DIAGNOSIS — Z3041 Encounter for surveillance of contraceptive pills: Secondary | ICD-10-CM | POA: Insufficient documentation

## 2019-07-08 DIAGNOSIS — N814 Uterovaginal prolapse, unspecified: Secondary | ICD-10-CM | POA: Insufficient documentation

## 2019-07-08 DIAGNOSIS — N816 Rectocele: Secondary | ICD-10-CM | POA: Insufficient documentation

## 2019-07-08 HISTORY — DX: Mixed incontinence: N39.46

## 2019-07-08 MED ORDER — CRYSELLE-28 0.3-30 MG-MCG PO TABS: 1.0000 | ORAL_TABLET | Freq: Every day | ORAL | 4 refills | Status: DC

## 2019-07-08 NOTE — Progress Notes (Signed)
Patient ID: Mikayla Snow, female   DOB: [birth year], 44 y.o.   MRN: [id number] History of Present Illness: Mikayla Snow is a 44 year old white female,married, G2P2 in of a well woman gyn exam and pap. She is the Investment banker, corporate at [employer].  PCP is Webb Silversmith NP.   Current Medications, Allergies, Past Medical History, Past Surgical History, Family History and Social History were reviewed in [employer] record.     Review of Systems:  Patient denies any headaches, hearing loss, fatigue, blurred vision, shortness of breath, chest pain, abdominal pain, problems with bowel movements, or intercourse. No joint pain or mood swings. Has gained about 10 lbs  Has some urge and stress UI   Physical Exam:BP 127/86 (BP Location: Left Arm, Patient Position: Sitting, Cuff Size: Normal)   Pulse 85   Ht 5' 5.5" (1.664 m)   Wt 183 lb (83 kg)   LMP 07/04/2019 (Exact Date)   BMI 29.99 kg/m  General:  Well developed, well nourished, no acute distress Skin:  Warm and dry Neck:  Midline trachea, normal thyroid, good ROM, no lymphadenopathy Lungs; Clear to auscultation bilaterally Breast:  No dominant palpable mass, retraction, or nipple discharge Cardiovascular: Regular rate and rhythm Abdomen:  Soft, non tender, no hepatosplenomegaly Pelvic:  External genitalia is normal in appearance, no lesions.  The vagina is normal in appearance,+period blood,has mild cystocele. Urethra has no lesions or masses. The cervix is bulbous, pap with high risk HPV 16/18 genotyping performed.  Uterus is felt to be normal size, shape, and contour.  No adnexal masses or tenderness noted.Bladder is non tender, no masses felt. Rectal: Good sphincter tone, no polyps, or hemorrhoids felt.  Hemoccult card sen home with her, has vaginal bleeding.Has +rectocle. Extremities/musculoskeletal:  No swelling or varicosities noted, no clubbing or cyanosis Psych:  No mood changes, alert and cooperative,seems happy Fall risk is  low PHQ 2 score is 0 Examination chaperoned by Estill Bamberg Rash LPN  Impression and Plan: 1. Encounter for gynecological examination with Papanicolaou smear of cervix Pap sent Physical in 1 year Pap in 3 if normal  Mammogram yearly Labs with PCP 2. Encounter for surveillance of contraceptive pills Continue OCs Meds ordered this encounter  Medications  . norgestrel-ethinyl estradiol (CRYSELLE-28) 0.3-30 MG-MCG tablet    Sig: Take 1 tablet by mouth daily.    Dispense:  84 tablet    Refill:  4    Order Specific Question:   Supervising Provider    Answer:   Elonda Husky, LUTHER H [[id number]]    3. Rectocele Given Medical explainer #4, after reviewing with her   4. Cystocele with uterine descensus Given medical explainer #4  5. Mixed stress and urge urinary incontinence Try doing Kegels again And lost about 20 lbs

## 2019-07-10 LAB — CYTOLOGY - PAP
Adequacy: ABSENT
Comment: NEGATIVE
Diagnosis: NEGATIVE
High risk HPV: NEGATIVE

## 2019-07-11 ENCOUNTER — Other Ambulatory Visit: Payer: Self-pay | Admitting: Internal Medicine

## 2019-07-26 ENCOUNTER — Ambulatory Visit
Admission: RE | Admit: 2019-07-26 | Discharge: 2019-07-26 | Discharge status: Absent without leave | Payer: BC Managed Care – PPO | Source: Ambulatory Visit

## 2019-07-26 ENCOUNTER — Telehealth: Payer: BC Managed Care – PPO | Admitting: Nurse Practitioner

## 2019-07-26 ENCOUNTER — Encounter (HOSPITAL_COMMUNITY): Payer: Self-pay

## 2019-07-26 ENCOUNTER — Other Ambulatory Visit: Payer: Self-pay

## 2019-07-26 ENCOUNTER — Ambulatory Visit (HOSPITAL_COMMUNITY)
Admission: EM | Admit: 2019-07-26 | Discharge: 2019-07-26 | Disposition: A | Discharge status: Home / Self Care | Payer: BC Managed Care – PPO | Attending: Urgent Care | Admitting: Urgent Care

## 2019-07-26 DIAGNOSIS — R350 Frequency of micturition: Secondary | ICD-10-CM | POA: Insufficient documentation

## 2019-07-26 DIAGNOSIS — R399 Unspecified symptoms and signs involving the genitourinary system: Secondary | ICD-10-CM

## 2019-07-26 DIAGNOSIS — N3001 Acute cystitis with hematuria: Secondary | ICD-10-CM | POA: Diagnosis present

## 2019-07-26 DIAGNOSIS — R300 Dysuria: Secondary | ICD-10-CM | POA: Insufficient documentation

## 2019-07-26 LAB — POCT URINALYSIS DIP (DEVICE)
Bilirubin Urine: NEGATIVE
Glucose, UA: NEGATIVE mg/dL
Ketones, ur: NEGATIVE mg/dL
Nitrite: NEGATIVE
Protein, ur: NEGATIVE mg/dL
Specific Gravity, Urine: 1.01 (ref 1.005–1.030)
Urobilinogen, UA: 0.2 mg/dL (ref 0.0–1.0)
pH: 6 (ref 5.0–8.0)

## 2019-07-26 MED ORDER — CEPHALEXIN 500 MG PO CAPS: 500.0000 mg | ORAL_CAPSULE | Freq: Two times a day (BID) | ORAL | 0 refills | Status: DC

## 2019-07-26 NOTE — Progress Notes (Signed)
Based on what you shared with me, I feel your condition warrants further evaluation and I recommend that you be seen for a face to face office visit. I am recommending you have a urine culture performed at this time because you have been treated prior and your symptoms have not resolved.  A urine culture will also help determine the correct antibiotic that is needed for treatment.     NOTE: If you entered your credit card information for this eVisit, you will not be charged. You may see a "hold" on your card for the $35 but that hold will drop off and you will not have a charge processed.   If you are having a true medical emergency please call 911.      For an urgent face to face visit, [place] has five urgent care centers for your convenience:      NEW:  [facility] [place] [facility] [place] Get Driving Directions [id number] [address], [place] [address] . 10 am - 6pm Monday - Friday    [facility]) Get Driving Directions [id number] [address] [place], [place] [address] . 10 am to 8 pm Monday-Friday . 12 pm to 8 pm [facility] Get Driving Directions [id number] [place], [place], [place] [address] . 8 am to 8 pm Monday-Friday . 9 am to 6 pm Saturday . 11 am to 6 pm Sunday     [facility] Get Driving Directions  [id number] [address], [place] [address] . 8 am to 8 pm Monday-Friday . 8 am to 4 pm [facility] [place] Get Driving Directions [id number] [place] [place],  [address] . 12 pm to 6 pm Monday-Friday      Your e-visit answers were reviewed by a board certified advanced clinical practitioner to complete your personal care plan.  Thank you for using e-Visits.   I have spent at least 5 minutes reviewing  and documenting in the patient's chart.

## 2019-07-26 NOTE — ED Provider Notes (Signed)
[place]   MRN: [id number] DOB: 1976-01-08  Subjective:   Mikayla Snow is a 44 y.o. female presenting for 5-day history of recurrent mild dysuria, urinary frequency, urinary urgency.  Patient was treated for an UTI, did not finish her antibiotics earlier this month.  Symptoms started and were very similar again this week.  She started drinking cranberry juice and pushing fluids about 2 days ago but has only gotten worse.  Denies fever, nausea, vomiting, abdominal pain, pelvic pain, flank pain.  No current facility-administered medications for this encounter.  Current Outpatient Medications:  .  Cetirizine HCl (ZYRTEC PO), Take by mouth.  , Disp: , Rfl:  .  fluticasone (FLONASE) 50 MCG/ACT nasal spray, Place 2 sprays into both nostrils as needed for allergies or rhinitis., Disp: , Rfl:  .  lisinopril (ZESTRIL) 20 MG tablet, TAKE 1 TABLET(20 MG) BY MOUTH DAILY, Disp: 30 tablet, Rfl: 0 .  omeprazole (PRILOSEC) 20 MG capsule, TAKE ONE CAPSULE BY MOUTH EVERY DAY 30 MINUTES BEFORE MEAL, Disp: 30 capsule, Rfl: 3 .  ferrous sulfate 325 (65 FE) MG tablet, Take 325 mg by mouth daily with breakfast., Disp: , Rfl:  .  norgestrel-ethinyl estradiol (CRYSELLE-28) 0.3-30 MG-MCG tablet, Take 1 tablet by mouth daily., Disp: 84 tablet, Rfl: 4   No Known Allergies  Past Medical History:  Diagnosis Date  . AMA (advanced maternal age) multigravida 77+   . Esophageal stricture   . GERD (gastroesophageal reflux disease)   . History of chickenpox   . History of hay fever   . History of urinary incontinence   . Hypertension      Past Surgical History:  Procedure Laterality Date  . WISDOM TOOTH EXTRACTION      Family History  Problem Relation Age of Onset  . Hypertension Mother   . Depression Mother   . Arthritis Paternal Grandmother        severe in hands  . Stroke Paternal Grandfather   . Hypertension Father   . Cancer Father        prostate cancer from [place] chemical exposure     Social History   Tobacco Use  . Smoking status: Never Smoker  . Smokeless tobacco: Never Used  Substance Use Topics  . Alcohol use: Yes    Alcohol/week: 0.0 standard drinks    Comment: occ  . Drug use: No    ROS   Objective:   Vitals: BP (!) 142/91 (BP Location: Right Arm)   Pulse 85   Temp 98.3 F (36.8 C) (Oral)   Resp 18   LMP 07/04/2019 (Exact Date)   SpO2 100%   Physical Exam Constitutional:      General: She is not in acute distress.    Appearance: Normal appearance. She is well-developed. She is not ill-appearing, toxic-appearing or diaphoretic.  HENT:     Head: Normocephalic and atraumatic.     Nose: Nose normal.     Mouth/Throat:     Mouth: Mucous membranes are moist.     Pharynx: Oropharynx is clear.  Eyes:     General: No scleral icterus.    Extraocular Movements: Extraocular movements intact.     Pupils: Pupils are equal, round, and reactive to light.  Cardiovascular:     Rate and Rhythm: Normal rate.  Pulmonary:     Effort: Pulmonary effort is normal.  Abdominal:     General: Bowel sounds are normal. There is no distension.     Palpations: Abdomen is soft. There  is no mass.     Tenderness: There is no abdominal tenderness. There is no right CVA tenderness, left CVA tenderness, guarding or rebound.  Skin:    General: Skin is warm and dry.  Neurological:     General: No focal deficit present.     Mental Status: She is alert and oriented to person, place, and time.  Psychiatric:        Mood and Affect: Mood normal.        Behavior: Behavior normal.        Thought Content: Thought content normal.        Judgment: Judgment normal.     Results for orders placed or performed during the hospital encounter of 07/26/19 (from the past 24 hour(s))  POCT urinalysis dip (device)     Status: Abnormal   Collection Time: 07/26/19  6:14 PM  Result Value Ref Range   Glucose, UA NEGATIVE NEGATIVE mg/dL   Bilirubin Urine NEGATIVE NEGATIVE   Ketones, ur  NEGATIVE NEGATIVE mg/dL   Specific Gravity, Urine 1.010 1.005 - 1.030   Hgb urine dipstick TRACE (A) NEGATIVE   pH 6.0 5.0 - 8.0   Protein, ur NEGATIVE NEGATIVE mg/dL   Urobilinogen, UA 0.2 0.0 - 1.0 mg/dL   Nitrite NEGATIVE NEGATIVE   Leukocytes,Ua SMALL (A) NEGATIVE    Assessment and Plan :   1. Acute cystitis with hematuria   2. Dysuria   3. Urinary frequency     Urine culture pending, start Keflex to address recurrent UTI.  Emphasized need to finish course of antibiotics this time around.  Push fluids, hydrate well. Counseled patient on potential for adverse effects with medications prescribed/recommended today, ER and return-to-clinic precautions discussed, patient verbalized understanding.    Jaynee Eagles, [place] 07/27/19 [phone]

## 2019-07-26 NOTE — ED Triage Notes (Signed)
Pt c/o urinary frequency, urgency and mild burning since Sunday. Has been taking cranberry juice and pushing fluids. Pt reports that she was tx for UTI a couple weeks ago.  Denies abdom pain, n/v/d, fever, or chills.

## 2019-07-28 LAB — URINE CULTURE: Culture: 60000 — AB

## 2019-07-31 ENCOUNTER — Other Ambulatory Visit: Payer: Self-pay | Admitting: *Deleted

## 2019-07-31 DIAGNOSIS — Z3041 Encounter for surveillance of contraceptive pills: Secondary | ICD-10-CM

## 2019-07-31 MED ORDER — CRYSELLE-28 0.3-30 MG-MCG PO TABS: 1.0000 | ORAL_TABLET | Freq: Every day | ORAL | 4 refills | Status: DC

## 2019-08-09 ENCOUNTER — Other Ambulatory Visit: Payer: Self-pay | Admitting: Internal Medicine

## 2019-09-02 ENCOUNTER — Other Ambulatory Visit: Payer: Self-pay

## 2019-09-02 ENCOUNTER — Ambulatory Visit (INDEPENDENT_AMBULATORY_CARE_PROVIDER_SITE_OTHER): Payer: BC Managed Care – PPO | Admitting: Internal Medicine

## 2019-09-02 ENCOUNTER — Other Ambulatory Visit (INDEPENDENT_AMBULATORY_CARE_PROVIDER_SITE_OTHER): Payer: BC Managed Care – PPO

## 2019-09-02 ENCOUNTER — Encounter: Payer: Self-pay | Admitting: Internal Medicine

## 2019-09-02 VITALS — BP 142/84 | HR 81 | Temp 98.4°F | Ht 65.75 in | Wt 183.0 lb

## 2019-09-02 DIAGNOSIS — R7309 Other abnormal glucose: Secondary | ICD-10-CM | POA: Diagnosis not present

## 2019-09-02 DIAGNOSIS — D508 Other iron deficiency anemias: Secondary | ICD-10-CM

## 2019-09-02 DIAGNOSIS — Z1211 Encounter for screening for malignant neoplasm of colon: Secondary | ICD-10-CM | POA: Diagnosis not present

## 2019-09-02 DIAGNOSIS — K219 Gastro-esophageal reflux disease without esophagitis: Secondary | ICD-10-CM

## 2019-09-02 DIAGNOSIS — I10 Essential (primary) hypertension: Secondary | ICD-10-CM

## 2019-09-02 DIAGNOSIS — Z0000 Encounter for general adult medical examination without abnormal findings: Secondary | ICD-10-CM | POA: Diagnosis not present

## 2019-09-02 DIAGNOSIS — N3946 Mixed incontinence: Secondary | ICD-10-CM | POA: Diagnosis not present

## 2019-09-02 LAB — CBC
HCT: 31.6 % — ABNORMAL LOW (ref 36.0–46.0)
Hemoglobin: 10.3 g/dL — ABNORMAL LOW (ref 12.0–15.0)
MCHC: 32.5 g/dL (ref 30.0–36.0)
MCV: 70.8 fl — ABNORMAL LOW (ref 78.0–100.0)
Platelets: 286 10*3/uL (ref 150.0–400.0)
RBC: 4.46 Mil/uL (ref 3.87–5.11)
RDW: 17.2 % — ABNORMAL HIGH (ref 11.5–15.5)
WBC: 4.7 10*3/uL (ref 4.0–10.5)

## 2019-09-02 LAB — LIPID PANEL
Cholesterol: 219 mg/dL — ABNORMAL HIGH (ref 0–200)
HDL: 38.4 mg/dL — ABNORMAL LOW (ref >39.00)
LDL Cholesterol: 152 mg/dL — ABNORMAL HIGH (ref 0–99)
NonHDL: 180.48
Total CHOL/HDL Ratio: 6
Triglycerides: 144 mg/dL (ref 0.0–149.0)
VLDL: 28.8 mg/dL (ref 0.0–40.0)

## 2019-09-02 LAB — COMPREHENSIVE METABOLIC PANEL
ALT: 16 U/L (ref 0–35)
AST: 17 U/L (ref 0–37)
Albumin: 4.3 g/dL (ref 3.5–5.2)
Alkaline Phosphatase: 58 U/L (ref 39–117)
BUN: 9 mg/dL (ref 6–23)
CO2: 27 mEq/L (ref 19–32)
Calcium: 9.1 mg/dL (ref 8.4–10.5)
Chloride: 103 mEq/L (ref 96–112)
Creatinine, Ser: 0.74 mg/dL (ref 0.40–1.20)
GFR: 84.5 mL/min (ref >60.00)
Glucose, Bld: 135 mg/dL — ABNORMAL HIGH (ref 70–99)
Potassium: 3.8 mEq/L (ref 3.5–5.1)
Sodium: 137 mEq/L (ref 135–145)
Total Bilirubin: 0.3 mg/dL (ref 0.2–1.2)
Total Protein: 7.1 g/dL (ref 6.0–8.3)

## 2019-09-02 LAB — IBC PANEL
Iron: 26 ug/dL — ABNORMAL LOW (ref 42–145)
Saturation Ratios: 5.1 % — ABNORMAL LOW (ref 20.0–50.0)
Transferrin: 365 mg/dL — ABNORMAL HIGH (ref 212.0–360.0)

## 2019-09-02 LAB — HEMOGLOBIN A1C: Hgb A1c MFr Bld: 5.6 % (ref 4.6–6.5)

## 2019-09-02 LAB — FERRITIN: Ferritin: 4.1 ng/mL — ABNORMAL LOW (ref 10.0–291.0)

## 2019-09-02 LAB — VITAMIN D 25 HYDROXY (VIT D DEFICIENCY, FRACTURES): VITD: 29.56 ng/mL — ABNORMAL LOW (ref 30.00–100.00)

## 2019-09-02 NOTE — Assessment & Plan Note (Signed)
Elevated today but she forgot to take her medication CMET today Continue Lisinopril  Will monitor

## 2019-09-02 NOTE — Assessment & Plan Note (Signed)
Encouraged her to void every 2 hours, cut fluids off by 7 pm

## 2019-09-02 NOTE — Assessment & Plan Note (Signed)
Continue Omeprazole given hx of stricture CBC and CMET today

## 2019-09-02 NOTE — Progress Notes (Signed)
Subjective:    Patient ID: Mikayla Snow, female    DOB: Apr 28, 1976, 44 y.o.   MRN: [id number]  HPI  Pt presents to the clinic today for her annual exam. She is also due to follow up chronic conditions.  HTN: Her BP today is 142/84. She is taking Lisinopril as prescribed but has not taken in the last few days because she forgot. She does not check her blood pressure at home, but does check it at home. There is no ECG on file.  GERD: She is not sure what triggers this. She has had a stricture in the past. She denies breakthrough on Omeprazole. Upper GI from 12/2009 reviewed.  Iron Deficiency Anemia: Her last H/H was 12.7/38.7, 05/2017. She is not taking an iron supplement as prescribed.  Mixed Incontinence: She is not currently taking any medications for this. She is following with GYN.  Flu: 01/2018 Tetanus: 05/2010 Covid: 06/2019, 07/2019 Pap Smear: 07/2019 Mammogram: 12/2018 Colon Screening: never Vision Screening: annually Dentist: biannually  Diet: She does eat meat. She consumes fruits and veggies daily. She tries to avoid fried foods. She drinks mostly water, coffee, flavored water, occasional soda. Exercise: Walking  Review of Systems      Past Medical History:  Diagnosis Date  . AMA (advanced maternal age) multigravida 64+   . Esophageal stricture   . GERD (gastroesophageal reflux disease)   . History of chickenpox   . History of hay fever   . History of urinary incontinence   . Hypertension     Current Outpatient Medications  Medication Sig Dispense Refill  . cephALEXin (KEFLEX) 500 MG capsule Take 1 capsule (500 mg total) by mouth 2 (two) times daily. 10 capsule 0  . Cetirizine HCl (ZYRTEC PO) Take by mouth.      . ferrous sulfate 325 (65 FE) MG tablet Take 325 mg by mouth daily with breakfast.    . fluticasone (FLONASE) 50 MCG/ACT nasal spray Place 2 sprays into both nostrils as needed for allergies or rhinitis.    Marland Kitchen lisinopril (ZESTRIL) 20 MG tablet TAKE 1  TABLET(20 MG) BY MOUTH DAILY 30 tablet 0  . norgestrel-ethinyl estradiol (CRYSELLE-28) 0.3-30 MG-MCG tablet Take 1 tablet by mouth daily. 84 tablet 4  . omeprazole (PRILOSEC) 20 MG capsule TAKE ONE CAPSULE BY MOUTH EVERY DAY 30 MINUTES BEFORE MEAL 30 capsule 3   No current facility-administered medications for this visit.    No Known Allergies  Family History  Problem Relation Age of Onset  . Hypertension Mother   . Depression Mother   . Arthritis Paternal Grandmother        severe in hands  . Stroke Paternal Grandfather   . Hypertension Father   . Cancer Father        prostate cancer from [place] chemical exposure    Social History   Socioeconomic History  . Marital status: Married    Spouse name: Not on file  . Number of children: Not on file  . Years of education: Not on file  . Highest education level: Not on file  Occupational History  . Not on file  Tobacco Use  . Smoking status: Never Smoker  . Smokeless tobacco: Never Used  Substance and Sexual Activity  . Alcohol use: Yes    Alcohol/week: 0.0 standard drinks    Comment: occ  . Drug use: No  . Sexual activity: Yes    Partners: Male    Birth control/protection: Pill  Other Topics Concern  .  Not on file  Social History Narrative  . Not on file   Social Determinants of Health   Financial Resource Strain:   . Difficulty of Paying Living Expenses:   Food Insecurity:   . Worried About Charity fundraiser in the Last Year:   . Arboriculturist in the Last Year:   Transportation Needs:   . Film/video editor (Medical):   Marland Kitchen Lack of Transportation (Non-Medical):   Physical Activity:   . Days of Exercise per Week:   . Minutes of Exercise per Session:   Stress:   . Feeling of Stress :   Social Connections:   . Frequency of Communication with Friends and Family:   . Frequency of Social Gatherings with Friends and Family:   . Attends Religious Services:   . Active Member of Clubs or Organizations:   .  Attends Archivist Meetings:   Marland Kitchen Marital Status:   Intimate Partner Violence:   . Fear of Current or Ex-Partner:   . Emotionally Abused:   Marland Kitchen Physically Abused:   . Sexually Abused:      Constitutional: Denies fever, malaise, fatigue, headache or abrupt weight changes.  HEENT: Denies eye pain, eye redness, ear pain, ringing in the ears, wax buildup, runny nose, nasal congestion, bloody nose, or sore throat. Respiratory: Denies difficulty breathing, shortness of breath, cough or sputum production.   Cardiovascular: Denies chest pain, chest tightness, palpitations or swelling in the hands or feet.  Gastrointestinal: Denies abdominal pain, bloating, constipation, diarrhea or blood in the stool.  GU: Denies urgency, frequency, pain with urination, burning sensation, blood in urine, odor or discharge. Musculoskeletal: Denies decrease in range of motion, difficulty with gait, muscle pain or joint pain and swelling.  Skin: Denies redness, rashes, lesions or ulcercations.  Neurological: Denies dizziness, difficulty with memory, difficulty with speech or problems with balance and coordination.  Psych: Denies anxiety, depression, SI/HI.  No other specific complaints in a complete review of systems (except as listed in HPI above).  Objective:   Physical Exam   BP (!) 142/84   Pulse 81   Temp 98.4 F (36.9 C) (Temporal)   Ht 5' 5.75" (1.67 m)   Wt 183 lb (83 kg)   LMP 08/28/2019   SpO2 98%   BMI 29.76 kg/m   Wt Readings from Last 3 Encounters:  07/08/19 183 lb (83 kg)  06/13/18 174 lb (78.9 kg)  05/16/17 173 lb (78.5 kg)    General: Appears her stated age, well developed, well nourished in NAD. Skin: Warm, dry and intact. No rashes noted. HEENT: Head: normal shape and size; Eyes: sclera white, no icterus, conjunctiva pink, PERRLA and EOMs intact;  Neck:  Neck supple, trachea midline. No masses, lumps or thyromegaly present.  Cardiovascular: Normal rate and rhythm. S1,S2  noted.  No murmur, rubs or gallops noted. No JVD or BLE edema.  Pulmonary/Chest: Normal effort and positive vesicular breath sounds. No respiratory distress. No wheezes, rales or ronchi noted.  Abdomen: Soft and nontender. Normal bowel sounds. No distention or masses noted. Liver, spleen and kidneys non palpable. Musculoskeletal: Strength 5/5 BUE/BLE. No difficulty with gait.  Neurological: Alert and oriented. Cranial nerves II-XII grossly intact. Coordination normal.  Psychiatric: Mood and affect normal. Behavior is normal. Judgment and thought content normal.     BMET    Component Value Date/Time   NA 138 05/16/2017 0902   K 3.7 05/16/2017 0902   CL 102 05/16/2017 0902   CO2  30 05/16/2017 0902   GLUCOSE 91 05/16/2017 0902   BUN 9 05/16/2017 0902   CREATININE 0.77 05/16/2017 0902   CALCIUM 9.0 05/16/2017 0902    Lipid Panel     Component Value Date/Time   CHOL 204 (H) 05/16/2017 0902   TRIG 104.0 05/16/2017 0902   HDL 43.40 05/16/2017 0902   CHOLHDL 5 05/16/2017 0902   VLDL 20.8 05/16/2017 0902   LDLCALC 140 (H) 05/16/2017 0902    CBC    Component Value Date/Time   WBC 4.5 05/16/2017 0902   RBC 4.74 05/16/2017 0902   HGB 12.7 05/16/2017 0902   HCT 38.7 05/16/2017 0902   PLT 247.0 05/16/2017 0902   MCV 81.7 05/16/2017 0902   MCH 25.9 (L) 06/15/2011 2230   MCHC 32.9 05/16/2017 0902   RDW 14.5 05/16/2017 0902   LYMPHSABS 1.3 11/23/2015 0848   MONOABS 0.3 11/23/2015 0848   EOSABS 0.2 11/23/2015 0848   BASOSABS 0.0 11/23/2015 0848    Hgb A1C No results found for: HGBA1C         Assessment & Plan:   Preventative Health Maintenance:  Encouraged her to get off flu shot in the fall Tetanus due 2022 Covid vaccine UTD Pap smear UTD Mammogram due 12/2019 Referral to GI for screening colonoscopy Encouraged her to consume a balanced diet and exercise regimen Advised her to see an eye doctor and dentist annually We will check CBC, C met, lipid, IBC, Ferritin  and vitamin D today  RTC in 1 year, sooner if needed Webb Silversmith, NP This visit occurred during the SARS-CoV-2 public health emergency.  Safety protocols were in place, including screening questions prior to the visit, additional usage of staff PPE, and extensive cleaning of exam room while observing appropriate contact time as indicated for disinfecting solutions.

## 2019-09-02 NOTE — Patient Instructions (Signed)
Health Maintenance, Female Adopting a healthy lifestyle and getting preventive care are important in promoting health and wellness. Ask your health care provider about:  The right schedule for you to have regular tests and exams.  Things you can do on your own to prevent diseases and keep yourself healthy. What should I know about diet, weight, and exercise? Eat a healthy diet   Eat a diet that includes plenty of vegetables, fruits, low-fat dairy products, and lean protein.  Do not eat a lot of foods that are high in solid fats, added sugars, or sodium. Maintain a healthy weight Body mass index (BMI) is used to identify weight problems. It estimates body fat based on height and weight. Your health care provider can help determine your BMI and help you achieve or maintain a healthy weight. Get regular exercise Get regular exercise. This is one of the most important things you can do for your health. Most adults should:  Exercise for at least 150 minutes each week. The exercise should increase your heart rate and make you sweat (moderate-intensity exercise).  Do strengthening exercises at least twice a week. This is in addition to the moderate-intensity exercise.  Spend less time sitting. Even light physical activity can be beneficial. Watch cholesterol and blood lipids Have your blood tested for lipids and cholesterol at 46 years of age, then have this test every 5 years. Have your cholesterol levels checked more often if:  Your lipid or cholesterol levels are high.  You are older than 46 years of age.  You are at high risk for heart disease. What should I know about cancer screening? Depending on your health history and family history, you may need to have cancer screening at various ages. This may include screening for:  Breast cancer.  Cervical cancer.  Colorectal cancer.  Skin cancer.  Lung cancer. What should I know about heart disease, diabetes, and high blood  pressure? Blood pressure and heart disease  High blood pressure causes heart disease and increases the risk of stroke. This is more likely to develop in people who have high blood pressure readings, are of African descent, or are overweight.  Have your blood pressure checked: ? Every 3-5 years if you are 18-39 years of age. ? Every year if you are 40 years old or older. Diabetes Have regular diabetes screenings. This checks your fasting blood sugar level. Have the screening done:  Once every three years after age 40 if you are at a normal weight and have a low risk for diabetes.  More often and at a younger age if you are overweight or have a high risk for diabetes. What should I know about preventing infection? Hepatitis B If you have a higher risk for hepatitis B, you should be screened for this virus. Talk with your health care provider to find out if you are at risk for hepatitis B infection. Hepatitis C Testing is recommended for:  Everyone born from 1945 through 1965.  Anyone with known risk factors for hepatitis C. Sexually transmitted infections (STIs)  Get screened for STIs, including gonorrhea and chlamydia, if: ? You are sexually active and are younger than 46 years of age. ? You are older than 46 years of age and your health care provider tells you that you are at risk for this type of infection. ? Your sexual activity has changed since you were last screened, and you are at increased risk for chlamydia or gonorrhea. Ask your health care provider if   you are at risk.  Ask your health care provider about whether you are at high risk for HIV. Your health care provider may recommend a prescription medicine to help prevent HIV infection. If you choose to take medicine to prevent HIV, you should first get tested for HIV. You should then be tested every 3 months for as long as you are taking the medicine. Pregnancy  If you are about to stop having your period (premenopausal) and  you may become pregnant, seek counseling before you get pregnant.  Take 400 to 800 micrograms (mcg) of folic acid every day if you become pregnant.  Ask for birth control (contraception) if you want to prevent pregnancy. Osteoporosis and menopause Osteoporosis is a disease in which the bones lose minerals and strength with aging. This can result in bone fractures. If you are 65 years old or older, or if you are at risk for osteoporosis and fractures, ask your health care provider if you should:  Be screened for bone loss.  Take a calcium or vitamin D supplement to lower your risk of fractures.  Be given hormone replacement therapy (HRT) to treat symptoms of menopause. Follow these instructions at home: Lifestyle  Do not use any products that contain nicotine or tobacco, such as cigarettes, e-cigarettes, and chewing tobacco. If you need help quitting, ask your health care provider.  Do not use street drugs.  Do not share needles.  Ask your health care provider for help if you need support or information about quitting drugs. Alcohol use  Do not drink alcohol if: ? Your health care provider tells you not to drink. ? You are pregnant, may be pregnant, or are planning to become pregnant.  If you drink alcohol: ? Limit how much you use to 0-1 drink a day. ? Limit intake if you are breastfeeding.  Be aware of how much alcohol is in your drink. In the [place] one drink equals one 12 oz bottle of beer (355 mL), one 5 oz glass of wine (148 mL), or one 1 oz glass of hard liquor (44 mL). General instructions  Schedule regular health, dental, and eye exams.  Stay current with your vaccines.  Tell your health care provider if: ? You often feel depressed. ? You have ever been abused or do not feel safe at home. Summary  Adopting a healthy lifestyle and getting preventive care are important in promoting health and wellness.  Follow your health care provider's instructions about healthy  diet, exercising, and getting tested or screened for diseases.  Follow your health care provider's instructions on monitoring your cholesterol and blood pressure. This information is not intended to replace advice given to you by your health care provider. Make sure you discuss any questions you have with your health care provider. Document Revised: 04/11/2018 Document Reviewed: 04/11/2018 Elsevier Patient Education  2020 Else[employer] Inc[employer]  

## 2019-09-02 NOTE — Assessment & Plan Note (Signed)
CBC, IBC and Ferritin today Will see based on labs if she needs to restart iron supplement

## 2019-09-04 ENCOUNTER — Encounter: Payer: Self-pay | Admitting: Internal Medicine

## 2019-09-09 ENCOUNTER — Other Ambulatory Visit: Payer: Self-pay | Admitting: Internal Medicine

## 2019-10-06 ENCOUNTER — Other Ambulatory Visit: Payer: Self-pay | Admitting: Internal Medicine

## 2019-11-12 ENCOUNTER — Other Ambulatory Visit: Payer: Self-pay | Admitting: Internal Medicine

## 2019-12-04 ENCOUNTER — Encounter: Payer: Self-pay | Admitting: Gastroenterology

## 2020-01-15 ENCOUNTER — Other Ambulatory Visit: Payer: BC Managed Care – PPO

## 2020-01-15 ENCOUNTER — Other Ambulatory Visit: Payer: Self-pay

## 2020-01-15 DIAGNOSIS — Z20822 Contact with and (suspected) exposure to covid-19: Secondary | ICD-10-CM

## 2020-01-17 LAB — NOVEL CORONAVIRUS, NAA: SARS-CoV-2, NAA: NOT DETECTED

## 2020-01-17 LAB — SARS-COV-2, NAA 2 DAY TAT

## 2020-01-18 ENCOUNTER — Telehealth: Payer: Self-pay | Admitting: *Deleted

## 2020-01-18 NOTE — Telephone Encounter (Signed)
Patient called to confirm her COVID-19 test results were negative.  She had received a message in her MyChart and then another message about SARS-2 and wasn't sure what all this meant.  This RN explained the results and messages to patient.    Pt notified of negative COVID-19 results. Understanding verbalized.  Patient also wanted to check on her son's COVID-19 test results.

## 2020-01-23 ENCOUNTER — Other Ambulatory Visit: Payer: Self-pay

## 2020-01-23 ENCOUNTER — Ambulatory Visit (AMBULATORY_SURGERY_CENTER): Payer: Self-pay

## 2020-01-23 VITALS — Ht 65.75 in | Wt 168.8 lb

## 2020-01-23 DIAGNOSIS — Z1211 Encounter for screening for malignant neoplasm of colon: Secondary | ICD-10-CM

## 2020-01-23 MED ORDER — SUTAB 1479-225-188 MG PO TABS: 1.0000 | ORAL_TABLET | ORAL | 0 refills | Status: DC

## 2020-01-23 NOTE — Progress Notes (Signed)
No egg or soy allergy known to patient  No issues with past sedation with any surgeries or procedures No intubation problems in the past  No FH of Malignant Hyperthermia No diet pills per patient No home 02 use per patient  No blood thinners per patient  Pt denies issues with constipation  No A fib or A flutter  EMMI video via MyChart  COVID 19 guidelines implemented in [facility] today with Pt and RN  Coupon given to pt in [facility] today , Code to Pharmacy  COVID vaccines completed on 07/2019 per pt;  Due to the COVID-19 pandemic we are asking patients to follow these guidelines. Please only bring one care partner. Please be aware that your care partner may wait in the car in the parking lot or if they feel like they will be too hot to wait in the car, they may wait in the lobby on the 4th floor. All care partners are required to wear a mask the entire time (we do not have any that we can provide them), they need to practice social distancing, and we will do a Covid check for all patient's and care partners when you arrive. Also we will check their temperature and your temperature. If the care partner waits in their car they need to stay in the parking lot the entire time and we will call them on their cell phone when the patient is ready for discharge so they can bring the car to the front of the building. Also all patient's will need to wear a mask into building.  

## 2020-01-24 ENCOUNTER — Encounter: Payer: Self-pay | Admitting: Gastroenterology

## 2020-02-06 ENCOUNTER — Encounter: Payer: BC Managed Care – PPO | Admitting: Gastroenterology

## 2020-02-07 ENCOUNTER — Other Ambulatory Visit: Payer: Self-pay

## 2020-02-07 ENCOUNTER — Ambulatory Visit (AMBULATORY_SURGERY_CENTER): Payer: BC Managed Care – PPO | Admitting: Gastroenterology

## 2020-02-07 ENCOUNTER — Encounter: Payer: Self-pay | Admitting: Gastroenterology

## 2020-02-07 VITALS — BP 106/79 | HR 61 | Temp 97.3°F | Resp 19 | Ht 65.75 in | Wt 168.8 lb

## 2020-02-07 DIAGNOSIS — D122 Benign neoplasm of ascending colon: Secondary | ICD-10-CM

## 2020-02-07 DIAGNOSIS — D123 Benign neoplasm of transverse colon: Secondary | ICD-10-CM

## 2020-02-07 DIAGNOSIS — Z1211 Encounter for screening for malignant neoplasm of colon: Secondary | ICD-10-CM | POA: Diagnosis present

## 2020-02-07 HISTORY — PX: COLONOSCOPY: SHX174

## 2020-02-07 MED ORDER — SODIUM CHLORIDE 0.9 % IV SOLN: 500.0000 mL | Freq: Once | INTRAVENOUS | Status: DC

## 2020-02-07 NOTE — Progress Notes (Signed)
Called to room to assist during endoscopic procedure.  Patient ID and intended procedure confirmed with present staff. Received instructions for my participation in the procedure from the performing physician.  

## 2020-02-07 NOTE — Progress Notes (Signed)
Pt's states no medical or surgical changes since previsit or office visit.  ° °Vitals CW °

## 2020-02-07 NOTE — Op Note (Signed)
[facility] Patient Name: Mikayla Snow Procedure Date: 02/07/2020 10:52 AM MRN: [id number] Endoscopist: Thornton Park MD, MD Age: 44 Referring MD:  Date of Birth: 1975/05/16 Gender: Female Account #: [id number] Procedure:                Colonoscopy Indications:              Screening for colorectal malignant neoplasm, This                            is the patient's first colonoscopy                           No known family history of colon cancer or polyps. Medicines:                Monitored Anesthesia Care Procedure:                Pre-Anesthesia Assessment:                           - Prior to the procedure, a History and Physical                            was performed, and patient medications and                            allergies were reviewed. The patient's tolerance of                            previous anesthesia was also reviewed. The risks                            and benefits of the procedure and the sedation                            options and risks were discussed with the patient.                            All questions were answered, and informed consent                            was obtained. Prior Anticoagulants: The patient has                            taken no previous anticoagulant or antiplatelet                            agents. ASA Grade Assessment: II - A patient with                            mild systemic disease. After reviewing the risks                            and benefits, the patient was deemed in  satisfactory condition to undergo the procedure.                           After obtaining informed consent, the colonoscope                            was passed under direct vision. Throughout the                            procedure, the patient's blood pressure, pulse, and                            oxygen saturations were monitored continuously. The                            Colonoscope was  introduced through the anus and                            advanced to the 3 cm into the ileum. The                            colonoscopy was performed with moderate difficulty                            due to a redundant colon, significant looping and a                            tortuous colon. Successful completion of the                            procedure was aided by applying abdominal pressure.                            The patient tolerated the procedure well. The                            quality of the bowel preparation was good. The                            terminal ileum, ileocecal valve, appendiceal                            orifice, and rectum were photographed. Scope In: 11:03:02 AM Scope Out: 11:19:56 AM Scope Withdrawal Time: 0 hours 11 minutes 13 seconds  Total Procedure Duration: 0 hours 16 minutes 54 seconds  Findings:                 Internal hemorrhoids were found.                           A few small-mouthed diverticula were found in the                            sigmoid colon and descending colon.  An 8 mm polyp was found in the descending colon.                            The polyp was flat. The polyp was removed with a                            cold snare. Resection and retrieval were complete.                            Estimated blood loss was minimal.                           A 4 mm polyp was found in the ascending colon. The                            polyp was sessile. The polyp was removed with a                            cold snare. Resection and retrieval were complete.                            Estimated blood loss was minimal.                           The colon is tortuous and redundant. The exam was                            otherwise without abnormality on direct and                            retroflexion views. Complications:            No immediate complications. Estimated blood loss:                             Minimal. Estimated Blood Loss:     Estimated blood loss was minimal. Impression:               - Internal hemorrhoids.                           - Diverticulosis in the sigmoid colon and in the                            descending colon.                           - One 8 mm polyp in the descending colon, removed                            with a cold snare. Resected and retrieved.                           - One 4 mm polyp in the ascending colon, removed  with a cold snare. Resected and retrieved.                           - The examination was otherwise normal on direct                            and retroflexion views. Recommendation:           - Patient has a contact number available for                            emergencies. The signs and symptoms of potential                            delayed complications were discussed with the                            patient. Return to normal activities tomorrow.                            Written discharge instructions were provided to the                            patient.                           - Follow a high fiber diet. Drink at least 64                            ounces of water daily. Add a daily stool bulking                            agent such as psyllium (an exampled would be                            Metamucil).                           - Continue present medications.                           - Await pathology results.                           - Repeat colonoscopy date to be determined after                            pending pathology results are reviewed for                            surveillance.                           - Emerging evidence supports eating a diet of  fruits, vegetables, grains, calcium, and yogurt                            while reducing red meat and alcohol may reduce the                            risk of colon cancer.                            - Given these results, all first degree relatives                            (brothers, sisters, children, parents) should start                            colon cancer screening at age 93.                           - Thank you for allowing me to be involved in your                            colon cancer prevention. Thornton Park MD, MD 02/07/2020 11:28:56 AM This report has been signed electronically.

## 2020-02-07 NOTE — Patient Instructions (Signed)
HANDOUTS PROVIDED ON: HIGH FIBER DIET, POLYPS, DIVERTICULOSIS, & HEMORRHOIDS  The polyps removed today have been sent for pathology.  The results can take 1-3 weeks to receive.  When your next colonoscopy should occur will be based on the pathology results.    You may resume your previous diet and medication schedule.  Thank you for allowing us to care for you today!!!   YOU HAD AN ENDOSCOPIC PROCEDURE TODAY AT THE [facility]:   Refer to the procedure report that was given to you for any specific questions about what was found during the examination.  If the procedure report does not answer your questions, please call your gastroenterologist to clarify.  If you requested that your care partner not be given the details of your procedure findings, then the procedure report has been included in a sealed envelope for you to review at your convenience later.  YOU SHOULD EXPECT: Some feelings of bloating in the abdomen. Passage of more gas than usual.  Walking can help get rid of the air that was put into your GI tract during the procedure and reduce the bloating. If you had a lower endoscopy (such as a colonoscopy or flexible sigmoidoscopy) you may notice spotting of blood in your stool or on the toilet paper. If you underwent a bowel prep for your procedure, you may not have a normal bowel movement for a few days.  Please Note:  You might notice some irritation and congestion in your nose or some drainage.  This is from the oxygen used during your procedure.  There is no need for concern and it should clear up in a day or so.  SYMPTOMS TO REPORT IMMEDIATELY:   Following lower endoscopy (colonoscopy or flexible sigmoidoscopy):  Excessive amounts of blood in the stool  Significant tenderness or worsening of abdominal pains  Swelling of the abdomen that is new, acute  Fever of 100F or higher  For urgent or emergent issues, a gastroenterologist can be reached at any hour by calling  [phone]. Do not use MyChart messaging for urgent concerns.    DIET:  We do recommend a small meal at first, but then you may proceed to your regular diet.  Drink plenty of fluids but you should avoid alcoholic beverages for 24 hours.  ACTIVITY:  You should plan to take it easy for the rest of today and you should NOT DRIVE or use heavy machinery until tomorrow (because of the sedation medicines used during the test).    FOLLOW UP: Our staff will call the number listed on your records 48-72 hours following your procedure to check on you and address any questions or concerns that you may have regarding the information given to you following your procedure. If we do not reach you, we will leave a message.  We will attempt to reach you two times.  During this call, we will ask if you have developed any symptoms of COVID 19. If you develop any symptoms (ie: fever, flu-like symptoms, shortness of breath, cough etc.) before then, please call [phone].  If you test positive for Covid 19 in the 2 weeks post procedure, please call and report this information to us.    If any biopsies were taken you will be contacted by phone or by letter within the next 1-3 weeks.  Please call us at [phone] if you have not heard about the biopsies in 3 weeks.    SIGNATURES/CONFIDENTIALITY: You and/or your care partner have signed   signed paperwork which will be entered into your electronic medical record.  These signatures attest to the fact that that the information above on your After Visit Summary has been reviewed and is understood.  Full responsibility of the confidentiality of this discharge information lies with you and/or your care-partner.

## 2020-02-07 NOTE — Progress Notes (Signed)
A/ox3, pleased with MAC, report to RN 

## 2020-02-11 ENCOUNTER — Telehealth: Payer: Self-pay

## 2020-02-11 NOTE — Telephone Encounter (Signed)
  Follow up Call-  Call back number 02/07/2020  Post procedure Call Back phone  # [phone]  Permission to leave phone message Yes  Some recent data might be hidden     Patient questions:  Do you have a fever, pain , or abdominal swelling? No. Pain Score  0 *  Have you tolerated food without any problems? Yes.    Have you been able to return to your normal activities? Yes.    Do you have any questions about your discharge instructions: Diet   No. Medications  No. Follow up visit  No.  Do you have questions or concerns about your Care? No.  Actions: * If pain score is 4 or above: No action needed, pain <4.  1. Have you developed a fever since your procedure? no  2.   Have you had an respiratory symptoms (SOB or cough) since your procedure? no  3.   Have you tested positive for COVID 19 since your procedure no  4.   Have you had any family members/close contacts diagnosed with the COVID 19 since your procedure?  no   If yes to any of these questions please route to Joylene John, RN and Joella Prince, RN

## 2020-02-12 ENCOUNTER — Encounter: Payer: Self-pay | Admitting: Gastroenterology

## 2020-02-17 ENCOUNTER — Encounter: Payer: Self-pay | Admitting: Internal Medicine

## 2020-03-11 MED ORDER — LISINOPRIL 10 MG PO TABS: 10.0000 mg | ORAL_TABLET | Freq: Every day | ORAL | 0 refills | Status: DC

## 2020-04-30 ENCOUNTER — Encounter: Payer: Self-pay | Admitting: Internal Medicine

## 2020-06-07 ENCOUNTER — Encounter: Payer: Self-pay | Admitting: Internal Medicine

## 2020-06-15 ENCOUNTER — Other Ambulatory Visit: Payer: Self-pay | Admitting: Internal Medicine

## 2020-09-02 ENCOUNTER — Other Ambulatory Visit: Payer: Self-pay | Admitting: Adult Health

## 2020-09-02 ENCOUNTER — Encounter: Payer: Self-pay | Admitting: Registered Nurse

## 2020-09-02 DIAGNOSIS — Z3041 Encounter for surveillance of contraceptive pills: Secondary | ICD-10-CM

## 2020-09-04 ENCOUNTER — Ambulatory Visit (INDEPENDENT_AMBULATORY_CARE_PROVIDER_SITE_OTHER): Payer: BC Managed Care – PPO | Admitting: Nurse Practitioner

## 2020-09-04 ENCOUNTER — Other Ambulatory Visit (HOSPITAL_BASED_OUTPATIENT_CLINIC_OR_DEPARTMENT_OTHER)
Admission: RE | Admit: 2020-09-04 | Discharge: 2020-09-04 | Disposition: A | Discharge status: Home / Self Care | Payer: BC Managed Care – PPO | Source: Ambulatory Visit | Attending: Nurse Practitioner | Admitting: Nurse Practitioner

## 2020-09-04 ENCOUNTER — Encounter (HOSPITAL_BASED_OUTPATIENT_CLINIC_OR_DEPARTMENT_OTHER): Payer: Self-pay | Admitting: Nurse Practitioner

## 2020-09-04 ENCOUNTER — Other Ambulatory Visit: Payer: Self-pay

## 2020-09-04 VITALS — BP 146/97 | HR 75 | Ht 66.0 in | Wt 153.6 lb

## 2020-09-04 DIAGNOSIS — N393 Stress incontinence (female) (male): Secondary | ICD-10-CM

## 2020-09-04 DIAGNOSIS — Z13220 Encounter for screening for lipoid disorders: Secondary | ICD-10-CM | POA: Diagnosis present

## 2020-09-04 DIAGNOSIS — R030 Elevated blood-pressure reading, without diagnosis of hypertension: Secondary | ICD-10-CM

## 2020-09-04 DIAGNOSIS — Z1321 Encounter for screening for nutritional disorder: Secondary | ICD-10-CM | POA: Insufficient documentation

## 2020-09-04 DIAGNOSIS — Z1329 Encounter for screening for other suspected endocrine disorder: Secondary | ICD-10-CM | POA: Diagnosis present

## 2020-09-04 DIAGNOSIS — Z13228 Encounter for screening for other metabolic disorders: Secondary | ICD-10-CM | POA: Insufficient documentation

## 2020-09-04 DIAGNOSIS — Z0000 Encounter for general adult medical examination without abnormal findings: Secondary | ICD-10-CM

## 2020-09-04 DIAGNOSIS — D509 Iron deficiency anemia, unspecified: Secondary | ICD-10-CM

## 2020-09-04 DIAGNOSIS — Z23 Encounter for immunization: Secondary | ICD-10-CM | POA: Diagnosis not present

## 2020-09-04 DIAGNOSIS — Z130 Encounter for screening for diseases of the blood and blood-forming organs and certain disorders involving the immune mechanism: Secondary | ICD-10-CM | POA: Insufficient documentation

## 2020-09-04 DIAGNOSIS — Z7689 Persons encountering health services in other specified circumstances: Secondary | ICD-10-CM | POA: Diagnosis not present

## 2020-09-04 LAB — COMPREHENSIVE METABOLIC PANEL
ALT: 25 U/L (ref 0–44)
AST: 25 U/L (ref 15–41)
Albumin: 4.3 g/dL (ref 3.5–5.0)
Alkaline Phosphatase: 35 U/L — ABNORMAL LOW (ref 38–126)
Anion gap: 7 (ref 5–15)
BUN: 7 mg/dL (ref 6–20)
CO2: 30 mmol/L (ref 22–32)
Calcium: 9.4 mg/dL (ref 8.9–10.3)
Chloride: 102 mmol/L (ref 98–111)
Creatinine, Ser: 0.81 mg/dL (ref 0.44–1.00)
GFR, Estimated: >60 mL/min (ref >60)
Glucose, Bld: 92 mg/dL (ref 70–99)
Potassium: 4.4 mmol/L (ref 3.5–5.1)
Sodium: 139 mmol/L (ref 135–145)
Total Bilirubin: 0.4 mg/dL (ref 0.3–1.2)
Total Protein: 7.7 g/dL (ref 6.5–8.1)

## 2020-09-04 LAB — CBC WITH DIFFERENTIAL/PLATELET
Abs Immature Granulocytes: 0.01 10*3/uL (ref 0.00–0.07)
Basophils Absolute: 0.1 10*3/uL (ref 0.0–0.1)
Basophils Relative: 1 %
Eosinophils Absolute: 0.1 10*3/uL (ref 0.0–0.5)
Eosinophils Relative: 3 %
HCT: 42.7 % (ref 36.0–46.0)
Hemoglobin: 13.9 g/dL (ref 12.0–15.0)
Immature Granulocytes: 0 %
Lymphocytes Relative: 25 %
Lymphs Abs: 1.1 10*3/uL (ref 0.7–4.0)
MCH: 27.6 pg (ref 26.0–34.0)
MCHC: 32.6 g/dL (ref 30.0–36.0)
MCV: 84.7 fL (ref 80.0–100.0)
Monocytes Absolute: 0.3 10*3/uL (ref 0.1–1.0)
Monocytes Relative: 7 %
Neutro Abs: 2.7 10*3/uL (ref 1.7–7.7)
Neutrophils Relative %: 64 %
Platelets: 273 10*3/uL (ref 150–400)
RBC: 5.04 MIL/uL (ref 3.87–5.11)
RDW: 13.2 % (ref 11.5–15.5)
WBC: 4.2 10*3/uL (ref 4.0–10.5)
nRBC: 0 % (ref 0.0–0.2)

## 2020-09-04 LAB — LIPID PANEL
Cholesterol: 231 mg/dL — ABNORMAL HIGH (ref 0–200)
HDL: 46 mg/dL (ref >40)
LDL Cholesterol: 169 mg/dL — ABNORMAL HIGH (ref 0–99)
Total CHOL/HDL Ratio: 5 RATIO
Triglycerides: 80 mg/dL (ref <150)
VLDL: 16 mg/dL (ref 0–40)

## 2020-09-04 LAB — HEMOGLOBIN A1C
Hgb A1c MFr Bld: 5.2 % (ref 4.8–5.6)
Mean Plasma Glucose: 102.54 mg/dL

## 2020-09-04 NOTE — Assessment & Plan Note (Signed)
Review of current and past medical history, medication history, social history, family history, and health maintenance recommendations.  Records will be requested from [facility] [place] and Dr. Juel Burrow [facility].

## 2020-09-04 NOTE — Assessment & Plan Note (Signed)
Will monitor for Lipid disorders as part of annual labs.  Will make changes to the plan of care as necessary based on lab results.

## 2020-09-04 NOTE — Assessment & Plan Note (Signed)
History of iron deficiency- currently on iron therapy.  No symptoms present today.  Will monitor labs today and make changes as necessary.

## 2020-09-04 NOTE — Assessment & Plan Note (Signed)
Will monitor labs today for any abnormalities as part of physical exam.  No concerning symptoms present today.  Will make changes to the plan of care as necessary based on lab results.

## 2020-09-04 NOTE — Patient Instructions (Addendum)
Recommendations from today's visit:  I have ordered labs today- our lab is located on the ground floor of the building- directly through the glass doors on the left when you exit the elevator. You will see the results on MyChart as soon as they come through- it will take me a few days to get them back and review them, but will be in touch with you if we see anything abnormal.   Tetanus booster due- we gave this today  Mammogram due- please let us know when this is complete and we will update our records  Keep track of your BP at home and let me know if your numbers are running high so we can keep up on this.   I have placed a referral to pelvic PT for you. They will call you to schedule this at your convenience.   Next colonoscopy due in October 2028 based on previous results  Your last colonoscopy results recommend that all of your first degree relatives (children, siblings, parents) should begin colon cancer screening with colonoscopy at age 31- please discuss this with your family to ensure appropriate screening is performed.   Your next pap smear with HPV testing is due in March 2026  Information on diet, exercise, and health maintenance recommendations are listed below. This is information to help you be sure you are on track for optimal health and monitoring. Please look over this and let us know if you have any questions or if you have completed any of the health maintenance outside of [facility] so that we can be sure your records are up to date.  ___________________________________________________________  Thank you for choosing [facility] for your Primary Care needs. I am excited for the opportunity to partner with you to meet your health care goals. It was a pleasure meeting you today!  I am an Adult-Geriatric Nurse Practitioner with a background in caring for patients for more than 20 years. I received my Paediatric nurse in Nursing and my  Doctor of Nursing Practice degrees at [employer]. I received additional fellowship training in primary care and sports medicine after receiving my doctorate degree. I provide primary care and sports medicine services to patients age 60 and older within this office. I am also a provider with the [facility] and the director of the APP Fellowship with [facility].  I am a [place] native, but have called the [place] area home for nearly 20 years and am proud to be a member of this community.   I am passionate about providing the best service to you through preventive medicine and supportive care. I consider you a part of the medical team and value your input. I work diligently to ensure that you are heard and your needs are met in a safe and effective manner. I want you to feel comfortable with me as your provider and want you to know that your health concerns are important to me.   For your information, our office hours are Monday- Friday 8:00 AM - 5:00 PM At this time I am not in the office on Wednesdays.  If you have questions or concerns, please call our office at [phone] or send [place] a MyChart message and we will respond as quickly as possible.   For all urgent or time sensitive needs we ask that you please call the office to avoid delays. MyChart is not constantly monitored and replies may take up  to 72 business hours.  MyChart Policy: . MyChart allows for you to see your visit notes, after visit summary, provider recommendations, lab and tests results, make an appointment, request refills, and contact your provider or the office for non-urgent questions or concerns.  . Providers are seeing patients during normal business hours and do not have built in time to review MyChart messages. We ask that you allow a minimum of 72 business hours for MyChart message responses.  . Complex MyChart concerns may require a visit. Your provider may request you  schedule a virtual or in person visit to ensure we are providing the best care possible. . MyChart messages sent after 4:00 PM on Friday will not be received by the provider until Monday morning.    Lab and Test Results: . You will receive your lab and test results on MyChart as soon as they are completed and results have been sent by the lab or testing facility. Due to this service, you will receive your results BEFORE your provider.  . Please allow a minimum of 72 business hours for your provider to receive and review lab and test results and contact you about.   . Most lab and test result comments from the provider will be sent through [facility]. Your provider may recommend changes to the plan of care, follow-up visits, repeat testing, ask questions, or request an office visit to discuss these results. You may reply directly to this message or call the office at [phone] to provide information for the provider or set up an appointment. . In some instances, you will be called with test results and recommendations. Please let us know if this is preferred and we will make note of this in your chart to provide this for you.    . If you have not heard a response to your lab or test results in 72 business hours, please call the office to let us know.   After Hours: . For all non-emergency after hours needs, please call the office at [phone] and select the option to reach the on-call provider service. On-call services are shared between multiple [facility] offices and therefore it will not be possible to speak directly with your provider. On-call providers may provide medical advice and recommendations, but are unable to provide refills for maintenance medications.  . For all emergency or urgent medical needs after normal business hours, we recommend that you seek care at the closest Urgent Care or Emergency Department to ensure appropriate treatment in a timely manner.  [facility] at  [facility] has a 24 hour emergency room located on the ground floor for your convenience.    Please do not hesitate to reach out to [place] with concerns.   Thank you, again, for choosing me as your health care partner. I appreciate your trust and look forward to learning more about you.   [name], DNP, AGNP-c ___________________________________________________________  Health Maintenance Recommendations Screening Testing  Mammogram  Every 1 -2 years based on history and risk factors  Starting at age 63  Pap Smear  Ages 21-39 every 3 years  Ages 79-65 every 5 years with HPV testing  More frequent testing may be required based on results and history  Colon Cancer Screening  Every 1-10 years based on test performed, risk factors, and history  Starting at age 10  Bone Density Screening  Every 2-10 years based on history  Starting at age 30 for women  Recommendations for men differ based on  medication usage, history, and risk factors  AAA Screening  One time ultrasound  Men 61-81 years old who have every smoked  Lung Cancer Screening  Low Dose Lung CT every 12 months  Age 37-80 years with a 30 pack-year smoking history who still smoke or who have quit within the last 15 years  Screening Labs  Routine  Labs: Complete Blood Count (CBC), Complete Metabolic Panel (CMP), Cholesterol (Lipid Panel)  Every 6-12 months based on history and medications  May be recommended more frequently based on current conditions or previous results  Hemoglobin A1c Lab  Every 3-12 months based on history and previous results  Starting at age 26 or earlier with diagnosis of diabetes, high cholesterol, BMI >26, and/or risk factors  Frequent monitoring for patients with diabetes to ensure blood sugar control  Thyroid Panel (TSH w/ T3 & T4)  Every 6 months based on history, symptoms, and risk factors  May be repeated more often if on medication  HIV  One time testing for  all patients 43 and older  May be repeated more frequently for patients with increased risk factors or exposure  Hepatitis C  One time testing for all patients 91 and older  May be repeated more frequently for patients with increased risk factors or exposure  Gonorrhea, Chlamydia  Every 12 months for all sexually active persons 13-24 years  Additional monitoring may be recommended for those who are considered high risk or who have symptoms  PSA  Men 60-52 years old with risk factors  Additional screening may be recommended from age 32-69 based on risk factors, symptoms, and history  Vaccine Recommendations  Tetanus Booster  All adults every 10 years  Flu Vaccine  All patients 6 months and older every year  COVID Vaccine  All patients 12 years and older  Initial dosing with booster  May recommend additional booster based on age and health history  HPV Vaccine  2 doses all patients age 14-26  Dosing may be considered for patients over 26  Shingles Vaccine (Shingrix)  2 doses all adults 26 years and older  Pneumonia (Pneumovax 23)  All adults 19 years and older  May recommend earlier dosing based on health history  Pneumonia (Prevnar 25)  All adults 19 years and older  Dosed 1 year after Pneumovax 23  Additional Screening, Testing, and Vaccinations may be recommended on an individualized basis based on family history, health history, risk factors, and/or exposure.  __________________________________________________________  Diet Recommendations for All Patients  I recommend that all patients maintain a diet low in saturated fats, carbohydrates, and cholesterol. While this can be challenging at first, it is not impossible and small changes can make big differences.  Things to try: Marland Kitchen Decreasing the amount of soda, sweet tea, and/or juice to one or less per day and replace with water o While water is always the first choice, if you do not like water you  may consider - adding a water additive without sugar to improve the taste - other sugar free drinks . Replace potatoes with a brightly colored vegetable at dinner . Use healthy oils, such as canola oil or olive oil, instead of butter or hard margarine . Limit your bread intake to two pieces or less a day . Replace regular pasta with low carb pasta options . Bake, broil, or grill foods instead of frying . Monitor portion sizes  . Eat smaller, more frequent meals throughout the day instead of large meals  An important  thing to remember is, if you love foods that are not great for your health, you don't have to give them up completely. Instead, allow these foods to be a reward when you have done well. Allowing yourself to still have special treats every once in a while is a nice way to tell yourself thank you for working hard to keep yourself healthy.   Also remember that every day is a new day. If you have a bad day and "fall off the wagon", you can still climb right back up and keep moving along on your journey!  We have resources available to help you!  Some websites that may be helpful include: . www.http://carter.biz/  . Www.VeryWellFit.com _____________________________________________________________  Activity Recommendations for All Patients  I recommend that all adults get at least 20 minutes of moderate physical activity that elevates your heart rate at least 5 days out of the week.  Some examples include: . Walking or jogging at a pace that allows you to carry on a conversation . Cycling (stationary bike or outdoors) . Water aerobics . Yoga . Weight lifting . Dancing If physical limitations prevent you from putting stress on your joints, exercise in a pool or seated in a chair are excellent options.  Do determine your MAXIMUM heart rate for activity: YOUR AGE - 220 = MAX HeartRate   Remember! . Do not push yourself too hard.  . Start slowly and build up your pace, speed, weight,  time in exercise, etc.  . Allow your body to rest between exercise and get good sleep. . You will need more water than normal when you are exerting yourself. Do not wait until you are thirsty to drink. Drink with a purpose of getting in at least 8, 8 ounce glasses of water a day plus more depending on how much you exercise and sweat.    If you begin to develop dizziness, chest pain, abdominal pain, jaw pain, shortness of breath, headache, vision changes, lightheadedness, or other concerning symptoms, stop the activity and allow your body to rest. If your symptoms are severe, seek emergency evaluation immediately. If your symptoms are concerning, but not severe, please let us know so that we can recommend further evaluation.   ________________________________________________________________  Urinary Incontinence  Urinary incontinence refers to a condition in which a person is unable to control where and when to pass urine. A person with this condition will urinate when he or she does not mean to (involuntarily). What are the causes? This condition may be caused by:  Medicines.  Infections.  Constipation.  Overactive bladder muscles.  Weak bladder muscles.  Weak pelvic floor muscles. These muscles provide support for the bladder, intestine, and, in women, the uterus.  Enlarged prostate in men. The prostate is a gland near the bladder. When it gets too big, it can pinch the urethra. With the urethra blocked, the bladder can weaken and lose the ability to empty properly.  Surgery.  Emotional factors, such as anxiety, stress, or post-traumatic stress disorder (PTSD).  Pelvic organ prolapse. This happens in women when organs shift out of place and into the vagina. This shift can prevent the bladder and urethra from working properly. What increases the risk? The following factors may make you more likely to develop this condition:  Older age.  Obesity and physical  inactivity.  Pregnancy and childbirth.  Menopause.  Diseases that affect the nerves or spinal cord (neurological diseases).  Long-term (chronic) coughing. This can increase pressure on the bladder  and pelvic floor muscles. What are the signs or symptoms? Symptoms may vary depending on the type of urinary incontinence you have. They include:  A sudden urge to urinate, but passing urine involuntarily before you can get to a bathroom (urge incontinence).  Suddenly passing urine with any activity that forces urine to pass, such as coughing, laughing, exercise, or sneezing (stress incontinence).  Needing to urinate often, but urinating only a small amount, or constantly dribbling urine (overflow incontinence).  Urinating because you cannot get to the bathroom in time due to a physical disability, such as arthritis or injury, or communication and thinking problems, such as Alzheimer disease (functional incontinence). How is this diagnosed? This condition may be diagnosed based on:  Your medical history.  A physical exam.  Tests, such as: ? Urine tests. ? X-rays of your kidney and bladder. ? Ultrasound. ? CT scan. ? Cystoscopy. In this procedure, a health care provider inserts a tube with a light and camera (cystoscope) through the urethra and into the bladder in order to check for problems. ? Urodynamic testing. These tests assess how well the bladder, urethra, and sphincter can store and release urine. There are different types of urodynamic tests, and they vary depending on what the test is measuring. To help diagnose your condition, your health care provider may recommend that you keep a log of when you urinate and how much you urinate. How is this treated? Treatment for this condition depends on the type of incontinence that you have and its cause. Treatment may include:  Lifestyle changes, such as: ? Quitting smoking. ? Maintaining a healthy weight. ? Staying active. Try to get  150 minutes of moderate-intensity exercise every week. Ask your health care provider which activities are safe for you. ? Eating a healthy diet.  Avoid high-fat foods, like fried foods.  Avoid refined carbohydrates like white bread and white rice.  Limit how much alcohol and caffeine you drink.  Increase your fiber intake. Foods such as fresh fruits, vegetables, beans, and whole grains are healthy sources of fiber.  Pelvic floor muscle exercises.  Bladder training, such as lengthening the amount of time between bathroom breaks, or using the bathroom at regular intervals.  Using techniques to suppress bladder urges. This can include distraction techniques or controlled breathing exercises.  Medicines to relax the bladder muscles and prevent bladder spasms.  Medicines to help slow or prevent the growth of a man's prostate.  Botox injections. These can help relax the bladder muscles.  Using pulses of electricity to help change bladder reflexes (electrical nerve stimulation).  For women, using a medical device to prevent urine leaks. This is a small, tampon-like, disposable device that is inserted into the urethra.  Injecting collagen or carbon beads (bulking agents) into the urinary sphincter. These can help thicken tissue and close the bladder opening.  Surgery. Follow these instructions at home: Lifestyle  Limit alcohol and caffeine. These can fill your bladder quickly and irritate it.  Keep yourself clean to help prevent odors and skin damage. Ask your doctor about special skin creams and cleansers that can protect the skin from urine.  Consider wearing pads or adult diapers. Make sure to change them regularly, and always change them right after experiencing incontinence. General instructions  Take over-the-counter and prescription medicines only as told by your health care provider.  Use the bathroom about every 3-4 hours, even if you do not feel the need to urinate. Try to  empty your bladder completely every  time. After urinating, wait a minute. Then try to urinate again.  Make sure you are in a relaxed position while urinating.  If your incontinence is caused by nerve problems, keep a log of the medicines you take and the times you go to the bathroom.  Keep all follow-up visits as told by your health care provider. This is important. Contact a health care provider if:  You have pain that gets worse.  Your incontinence gets worse. Get help right away if:  You have a fever or chills.  You are unable to urinate.  You have redness in your groin area or down your legs. Summary  Urinary incontinence refers to a condition in which a person is unable to control where and when to pass urine.  This condition may be caused by medicines, infection, weak bladder muscles, weak pelvic floor muscles, enlargement of the prostate (in men), or surgery.  The following factors increase your risk for developing this condition: older age, obesity, pregnancy and childbirth, menopause, neurological diseases, and chronic coughing.  There are several types of urinary incontinence. They include urge incontinence, stress incontinence, overflow incontinence, and functional incontinence.  This condition is usually treated first with lifestyle and behavioral changes, such as quitting smoking, eating a healthier diet, and doing regular pelvic floor exercises. Other treatment options include medicines, bulking agents, medical devices, electrical nerve stimulation, or surgery. This information is not intended to replace advice given to you by your health care provider. Make sure you discuss any questions you have with your health care provider. Document Revised: 04/28/2017 Document Reviewed: 07/28/2016 Elsevier Patient Education  2021 [place].  Tdap (Tetanus, Diphtheria, Pertussis) Vaccine: What You Need to Know 1. Why get vaccinated? Tdap vaccine can prevent tetanus,  diphtheria, and pertussis. Diphtheria and pertussis spread from person to person. Tetanus enters the body through cuts or wounds.  TETANUS (T) causes painful stiffening of the muscles. Tetanus can lead to serious health problems, including being unable to open the mouth, having trouble swallowing and breathing, or death.  DIPHTHERIA (D) can lead to difficulty breathing, heart failure, paralysis, or death.  PERTUSSIS (aP), also known as "whooping cough," can cause uncontrollable, violent coughing that makes it hard to breathe, eat, or drink. Pertussis can be extremely serious especially in babies and young children, causing pneumonia, convulsions, brain damage, or death. In teens and adults, it can cause weight loss, loss of bladder control, passing out, and rib fractures from severe coughing. 2. Tdap vaccine Tdap is only for children 7 years and older, adolescents, and adults.  Adolescents should receive a single dose of Tdap, preferably at age 30 or 68 years. Pregnant people should get a dose of Tdap during every pregnancy, preferably during the  part of the third trimester, to help protect the newborn from pertussis. Infants are most at risk for severe, life-threatening complications from pertussis. Adults who have never received Tdap should get a dose of Tdap. Also, adults should receive a booster dose of either Tdap or Td (a different vaccine that protects against tetanus and diphtheria but not pertussis) every 10 years, or after 5 years in the case of a severe or dirty wound or burn. Tdap may be given at the same time as other vaccines. 3. Talk with your health care provider Tell your vaccine provider if the person getting the vaccine:  Has had an allergic reaction after a previous dose of any vaccine that protects against tetanus, diphtheria, or pertussis, or has any severe, life-threatening  allergies  Has had a coma, decreased level of consciousness, or prolonged seizures within 7  days after a previous dose of any pertussis vaccine (DTP, DTaP, or Tdap)  Has seizures or another nervous system problem  Has ever had Guillain-Barr Syndrome (also called "GBS")  Has had severe pain or swelling after a previous dose of any vaccine that protects against tetanus or diphtheria In some cases, your health care provider may decide to postpone Tdap vaccination until a future visit. People with minor illnesses, such as a cold, may be vaccinated. People who are moderately or severely ill should usually wait until they recover before getting Tdap vaccine.  Your health care provider can give you more information. 4. Risks of a vaccine reaction  Pain, redness, or swelling where the shot was given, mild fever, headache, feeling tired, and nausea, vomiting, diarrhea, or stomachache sometimes happen after Tdap vaccination. People sometimes faint after medical procedures, including vaccination. Tell your provider if you feel dizzy or have vision changes or ringing in the ears.  As with any medicine, there is a very remote chance of a vaccine causing a severe allergic reaction, other serious injury, or death. 5. What if there is a serious problem? An allergic reaction could occur after the vaccinated person leaves the clinic. If you see signs of a severe allergic reaction (hives, swelling of the face and throat, difficulty breathing, a fast heartbeat, dizziness, or weakness), call 9-1-1 and get the person to the nearest hospital. For other signs that concern you, call your health care provider.  Adverse reactions should be reported to the Vaccine Adverse Event Reporting System (VAERS). Your health care provider will usually file this report, or you can do it yourself. Visit the VAERS website at www.vaers.SamedayNews.es or call (309)481-4580. VAERS is only for reporting reactions, and VAERS staff members do not give medical advice. 6. The National Vaccine Injury Compensation Program The [employer]  Vaccine Injury Compensation Program (VICP) is a federal program that was created to compensate people who may have been injured by certain vaccines. Claims regarding alleged injury or death due to vaccination have a time limit for filing, which may be as short as two years. Visit the VICP website at GoldCloset.com.ee or call 405-787-0345 to learn about the program and about filing a claim. 7. How can I learn more?  Ask your health care provider.  Call your local or state health department.  Visit the website of the Food and Drug Administration (FDA) for vaccine package inserts and additional information at TraderRating.uy.  Contact the Centers for Disease Control and Prevention (CDC): ? Call 5864680035 (1-800-CDC-INFO) or ? Visit CDC's website at http://hunter.com/. Vaccine Information Statement Tdap (Tetanus, Diphtheria, Pertussis) Vaccine (12/06/2019) This information is not intended to replace advice given to you by your health care provider. Make sure you discuss any questions you have with your health care provider. Document Revised: 01/01/2020 Document Reviewed: 01/01/2020 Elsevier Patient Education  2021 [employer].

## 2020-09-04 NOTE — Progress Notes (Signed)
Cholesterol elevated- possibly due to recent dietary changes.  Recommend monitoring and decreasing saturated fats and re-establishing regular physical activity to help see if we can get this down naturally.  Will plan to recheck in 6 months or sooner if needed.   No signs of diabetes!  Kidney, liver, and electrolytes look good. Alk phos on low side, but likely due to vitamin d replacement. No other abnormalities noted.   CBC looks great!

## 2020-09-04 NOTE — Assessment & Plan Note (Signed)
Prolonged issue without acute symptoms or complications.  Recommend OB evaluation for concern for prolapse- will be happy to evaluate if you are unable to get in with them.  Recommendation for pelvic PT for evaluation of symptoms and therapy.  Recommendation for kegal exercises for pelvic floor strengthening.  Monitor for symptoms of skin irritation that may present with increased moisture.

## 2020-09-04 NOTE — Progress Notes (Signed)
Mikayla Keeler, DNP, AGNP-c Primary Care Services ______________________________________________________________________________________________________________________________________________  HPI Mikayla Snow is a 45 y.o. year old female presenting to [facility] today to establish care.  Previous PCP: Webb Silversmith, NP with [facility] Last CPE was: 09/02/2019 Other providers seen: [place], [facility]  Concerns today: Stress Incontinence Mild Mood Changes  Stress Incontinence Endorses urine leakage with sneezing, cough, and increased abdominal pressure. She reports that she does wear an incontinence pad daily. She endorses that this has caused some difficulty with intimacy. She has had two children and reports that it appears to be worsening over time. She is unsure if there is a possible prolapse present. She would like to try pelvic PT.   Mood She states that she has had some mild mood changes recently due to increased stress. She recently lost a friend to cervical cancer and that has been difficult. 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 She states that this has not been eating as healthily or exercising as regularly as usual during this time. She reports that she has not been checking her blood pressure regularly during this time- but states that it typically runs normal. She denies any significant mood changes, SI/HI, or self harm.   Narrative: Mikayla Snow is Married and currently lives with her husband Marya Amsler, and their two children. Endorses 2 children, ages 33 (girl) and 59 (boy). Feels safe in their current relationships and home environment.  Currently working as a Office manager at [employer] consists of roasted vegetables daily with healthy low fat protein options. Uses noom app for dietary monitoring.  Physical activity of  walking and working out reported 5 times per week for at least 30 minutes per day. denies nicotine use, denies recreational drug use, and reports alcohol use of social use. reports are currently sexually active with 1 sexual partners.  denies history of STI and denies concerns for STI today. is not planning pregnancy at this time. Contraception choices are OCP. denies genital concerns. denies recent changes to bowel habits reports recent changes to bladder habits- stress incontinence with leakage reports recent changes to skin- sees dermatology and has regular evaluations reports recent mood related changes with recent loss of a friend and increased stress with recent musicals at school. No significant concerns reported.  PHQ and GAD listed below. PHQ9 Today: Depression screen [facility] 2/9 09/04/2020 09/02/2019 07/08/2019  Decreased Interest 0 0 0  Down, Depressed, Hopeless 0 0 0  PHQ - 2 Score 0 0 0  Altered sleeping 0 - -  Tired, decreased energy 1 - -  Change in appetite 1 - -  Feeling bad or failure about yourself  0 - -  Trouble concentrating 0 - -  Moving slowly or fidgety/restless 0 - -  Suicidal thoughts 0 - -  PHQ-9 Score 2 - -  Difficult doing work/chores Not difficult at all - -   GAD7 Today: GAD 7 : Generalized Anxiety Score 09/04/2020  Nervous, Anxious, on Edge 1  Control/stop worrying 0  Worry too much - different things 0  Trouble relaxing 0  Restless 0  Easily annoyed or irritable 0  Afraid - awful might happen 0  Total GAD 7 Score 1  Anxiety Difficulty Not difficult at all    Health Maintenance: Labs: 09/02/2019 Due Mammogram: 12/30/2018 Due Colonoscopy: 02/07/2020- next due in October 2028 DEXA: NA Pap: 07/08/2019 (negative) due  06/2024  HPV: 07/08/2019 (negative) due 06/2024  PPSV23: NA PCV13: NA Shingrix: NA Flu: 02/13/2020 - Due in fall COVID: Vaccine complete- no booster HPV: Aged Out Tdap: 05/02/2010 Due  PMH Past Medical History:  Diagnosis Date  .  Allergy    seasonal allergies  . AMA (advanced maternal age) multigravida 82+   . Anemia    on meds  . Esophageal stricture   . Essential hypertension 02/03/2017  . GERD (gastroesophageal reflux disease)    on meds  . History of chickenpox   . History of hay fever   . History of urinary incontinence   . Hypertension    on meds  . Mixed stress and urge urinary incontinence 07/08/2019    ROS All review of systems negative except what is listed in the HPI  PHYSICAL EXAM General Appearance:  awake, alert, oriented, in no acute distress and well developed, well nourished Skin:  skin color, texture, turgor are normal; there are no bruises, rashes or lesions., well healing wounds from recent dermatologic evaluation and removal of nevi. Head/face:  NCAT and popping noted in the jaw consistent with TMJ symptoms.  Eyes:  No gross abnormalities., PERRL, EOMI and Sclera nonicteric Ears:  canals and TMs NI Nose/Sinuses:  Nares normal. Septum midline. Mucosa normal. No drainage or sinus tenderness. Mouth/Throat:  Mucosa moist, no lesions; pharynx without erythema, edema or exudate. Neck:  neck- supple, no mass, non-tender, no bruits and no jvd Back:  no pain to palpation, good flexion and extension, reflexes are 2+ and symmetric, motor and sensory appear to be normal Lungs:  Normal expansion.  Clear to auscultation.  No rales, rhonchi, or wheezing., No chest wall tenderness., No kyphosis or scoliosis. Heart:  Heart sounds are normal.  Regular rate and rhythm without murmur, gallop or rub. Abdomen:  Soft, non-tender, normal bowel sounds; no bruits, organomegaly or masses. Extremities: Extremities warm to touch, pink, with no edema., no edema and no calf pain Musculoskeletal:  Range of motion normal in hips, knees, shoulders, and spine., No joint swelling, deformity, or tenderness. Peripheral Pulses:  Capillary refill <2secs, strong peripheral pulses Neurologic:  Alert and oriented x 3, gait  normal., reflexes normal and symmetric, strength and  sensation grossly normal Psych exam:alert,oriented, in NAD with a full range of affect, normal behavior and no psychotic features  ASSESSMENT AND PLAN Problem List Items Addressed This Visit      Other   ANEMIA   Encounter to establish care - Primary    Review of current and past medical history, medication history, social history, family history, and health maintenance recommendations.  Records will be requested from Dermatology- [place] and Dr. Juel Burrow Office.       Stress incontinence    Prolonged issue without acute symptoms or complications.  Recommend OB evaluation for concern for prolapse- will be happy to evaluate if you are unable to get in with them.  Recommendation for pelvic PT for evaluation of symptoms and therapy.  Recommendation for kegal exercises for pelvic floor strengthening.  Monitor for symptoms of skin irritation that may present with increased moisture.       Relevant Orders   Ambulatory referral to Physical Therapy   Screening for iron deficiency anemia    History of iron deficiency- currently on iron therapy.  No symptoms present today.  Will monitor labs today and make changes as necessary.        Relevant Orders   CBC w/Diff/Platelet (Completed)   Screening for lipid  disorders    Will monitor for Lipid disorders as part of annual labs.  Will make changes to the plan of care as necessary based on lab results.       Relevant Orders   Lipid panel   Screening for endocrine, nutritional, metabolic and immunity disorder    Will monitor labs today for any abnormalities as part of physical exam.  No concerning symptoms present today.  Will make changes to the plan of care as necessary based on lab results.       Relevant Orders   Comprehensive metabolic panel   Hemoglobin A1c   Elevated blood pressure reading    History of HTN with previous Lisinopril use- stopped by former PCP in February  of this year due to excellent control with weight loss, diet, and exercise.  BP 146/97 in office today.  She does endorse recent stressors and limited exercise with worsening dietary habits (over past 6 weeks or so). Most likely these have contributed to the increase seen today.  At this time recommend at home monitoring and sending me the results for evaluation to determine if medication needs to be restarted.  Suspect transient changes given recent changes. Follow-up if elevation in BP continues.       Encounter for annual physical exam    CPE performed today without abnormal findings present.  Will obtain labs today.  Recommendations on diet, exercise, and health maintenance provided.  Colonoscopy: 02/07/2020- next due in October 2028 Pap: 07/08/2019 (negative) due 06/2024  HPV: 07/08/2019 (negative) due 06/2024 Flu: 02/13/2020 - Due in fall COVID: Vaccine complete- no booster Tdap: Given today Mammogram: To be scheduled by patient in near future          Education provided today during visit and on AVS for patient to review at home. Diet and Exercise recommendations as well as routine health maintenance information provided.  Current diagnoses discussed and recommendations provided.   Outpatient Encounter Medications as of 09/04/2020  Medication Sig  . CALCIUM PO Take by mouth daily.  . cetirizine (ZYRTEC) 10 MG tablet Take 10 mg by mouth daily.  . CRYSELLE-28 0.3-30 MG-MCG tablet TAKE 1 TABLET BY MOUTH DAILY  . ferrous sulfate 325 (65 FE) MG tablet Take 325 mg by mouth daily with breakfast.  . fluticasone (FLONASE) 50 MCG/ACT nasal spray Place 2 sprays into both nostrils as needed for allergies or rhinitis.  Marland Kitchen lisinopril (ZESTRIL) 10 MG tablet TAKE 1 TABLET(10 MG) BY MOUTH DAILY  . mupirocin ointment (BACTROBAN) 2 % Apply topically 3 (three) times daily as needed.  Marland Kitchen omeprazole (PRILOSEC) 20 MG capsule TAKE ONE CAPSULE BY MOUTH EVERY DAY 30 MINUTES BEFORE MEAL  . VITAMIN D  PO Take by mouth.  . [DISCONTINUED] augmented betamethasone dipropionate (DIPROLENE-AF) 0.05 % cream Apply topically.  . [DISCONTINUED] Cetirizine HCl (ZYRTEC PO) Take by mouth.   No facility-administered encounter medications on file as of 09/04/2020.    Return in about 1 year (around 09/04/2021) for Labs today- CPE done today.  Orma Render, DNP, AGNP-c

## 2020-09-04 NOTE — Assessment & Plan Note (Signed)
History of HTN with previous Lisinopril use- stopped by former PCP in February of this year due to excellent control with weight loss, diet, and exercise.  BP 146/97 in office today.  She does endorse recent stressors and limited exercise with worsening dietary habits (over past 6 weeks or so). Most likely these have contributed to the increase seen today.  At this time recommend at home monitoring and sending me the results for evaluation to determine if medication needs to be restarted.  Suspect transient changes given recent changes. Follow-up if elevation in BP continues.

## 2020-09-04 NOTE — Assessment & Plan Note (Signed)
Vaccine given today.  VIS provided.

## 2020-09-04 NOTE — Assessment & Plan Note (Signed)
CPE performed today without abnormal findings present.  Will obtain labs today.  Recommendations on diet, exercise, and health maintenance provided.  Colonoscopy: 02/07/2020- next due in October 2028 Pap: 07/08/2019 (negative) due 06/2024  HPV: 07/08/2019 (negative) due 06/2024 Flu: 02/13/2020 - Due in fall COVID: Vaccine complete- no booster Tdap: Given today Mammogram: To be scheduled by patient in near future

## 2020-11-10 ENCOUNTER — Telehealth: Payer: Self-pay | Admitting: Adult Health

## 2020-11-10 DIAGNOSIS — Z3041 Encounter for surveillance of contraceptive pills: Secondary | ICD-10-CM

## 2020-11-10 MED ORDER — CRYSELLE-28 0.3-30 MG-MCG PO TABS: 1.0000 | ORAL_TABLET | Freq: Every day | ORAL | 4 refills | Status: DC

## 2020-11-10 NOTE — Telephone Encounter (Signed)
Pt is scheduled for PAP/physical for 8/23 - will run out of her [facility] end of July Can we give a refill for 1 month until she can be seen?   [facility] [address], [place]  (Routed to [place] due to short nurses)   Please advise & notify pt

## 2020-11-10 NOTE — Telephone Encounter (Signed)
Pt aware Cryselle refilled

## 2020-11-23 ENCOUNTER — Encounter: Payer: Self-pay | Admitting: Physical Therapy

## 2020-11-23 ENCOUNTER — Ambulatory Visit: Payer: BC Managed Care – PPO | Attending: Nurse Practitioner | Admitting: Physical Therapy

## 2020-11-23 ENCOUNTER — Other Ambulatory Visit: Payer: Self-pay

## 2020-11-23 DIAGNOSIS — R279 Unspecified lack of coordination: Secondary | ICD-10-CM | POA: Insufficient documentation

## 2020-11-23 DIAGNOSIS — M6281 Muscle weakness (generalized): Secondary | ICD-10-CM | POA: Insufficient documentation

## 2020-11-23 NOTE — Therapy (Signed)
[facility] [address] [place], [place], [address] Phone: [phone]   Fax:  [phone]  Physical Therapy Evaluation  Patient Details  Name: Mikayla Snow MRN: [id number] Date of Birth: 1976/02/22 Referring Provider (PT): Early, Coralee Pesa, NP   Encounter Date: 11/23/2020   PT End of Session - 11/23/20 1724     Visit Number 1    Date for PT Re-Evaluation 02/15/21    Authorization Type BCBS    PT Start Time 1445    PT Stop Time 1525    PT Time Calculation (min) 40 min    Activity Tolerance Patient tolerated treatment well    Behavior During Therapy [facility] for tasks assessed/performed             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    Past Surgical History:  Procedure Laterality Date   COLONOSCOPY  02/07/2020   WISDOM TOOTH EXTRACTION      There were no vitals filed for this visit.    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  Leakage started getting worse over 2 years.    Patient Stated Goals be able to run if possible    Currently in Pain? No/denies                [facility] PT Assessment - 11/23/20 0001       Assessment   Medical Diagnosis N39.3 (ICD-10-CM) - Stress incontinence    Referring Provider (PT) Early, Coralee Pesa, NP    Onset Date/Surgical Date --   2 years worse, but had leakage for 9 years   Prior Therapy No      Precautions   Precautions None      Balance Screen   Has the patient fallen in the past 6 months No      [place] residence      Prior Function   Level of Independence Independent    Vocation Full time employment    Vocation Requirements singing and standing    Leisure exercise low intensity      Cognition   Overall Cognitive Status Within Functional Limits for tasks assessed      Posture/Postural Control   Posture/Postural Control Postural limitations    Postural Limitations Anterior pelvic tilt;Increased lumbar lordosis      ROM / Strength   AROM / PROM / Strength Strength;PROM;AROM      PROM   Overall PROM Comments Lt hip IR 80%      Strength   Overall Strength Comments hip 5/5 bil; core 4/5 DRA 1 finger and 2 at umbilicus      Flexibility   Soft Tissue Assessment /Muscle Length yes    Hamstrings 80%      Palpation   Palpation comment tight lumbar erectors and thoracic erectors      Ambulation/Gait   Gait Pattern Within Functional Limits  Objective measurements completed on examination: See above findings.     Pelvic Floor Special Questions - 11/23/20 0001     Prior Pelvic/Prostate Exam Yes    Are you Pregnant or attempting pregnancy? No    Prior Pregnancies Yes    Number of Pregnancies 2    Number of Vaginal Deliveries 2    Any difficulty with labor and deliveries --   large babies   Currently Sexually Active Yes    Is this Painful Yes    Marinoff Scale pain prevents any attempts at intercourse    Urinary Leakage Yes    How often occasional with sneeze a little that wets a liner    Pad use yes - 1/day usually 2 at least2/week    Activities that cause leaking Coughing    Urinary urgency Yes    Fecal incontinence --   occasional straining   Fluid intake 48-64oz    Falling out feeling (prolapse) Yes    Activities that cause feeling of prolapse intercourse    Perineal Body/Introitus  Descended    Prolapse Anterior Wall;Posterior Wall;Uterine    Pelvic Floor Internal Exam pt identity confirmed and internal  soft tissue assessed with consent    Exam Type Vaginal    Palpation levator weakness possible avulsion bil Lt weaker than Rt    Strength Flicker    Strength # of reps 2   3 quick   Strength # of seconds 1    Tone low                           PT Long Term Goals - 11/23/20 1725       PT LONG TERM GOAL #1   Title ind with HEP    Time 12    Period Weeks    Status New    Target Date 02/15/21      PT LONG TERM GOAL #2   Title Pt reports she has at least 50% less leakage    Time 12    Period Weeks    Status New    Target Date 02/15/21      PT LONG TERM GOAL #3   Title Pt reports 1/3 marinoff scale    Baseline 3/3    Time 12    Period Weeks    Status New    Target Date 02/15/21      PT LONG TERM GOAL #4   Title Pt will demonstrate at least 3/5 MMT and can hold for at least 6 seconds for improved bladder control    Baseline 1/5 MMT holding 1 sec    Time 12    Period Weeks    Status New    Target Date 02/15/21      PT LONG TERM GOAL #5   Title Pt will be able to have BM without any straining    Time 12    Period Weeks    Status New    Target Date 02/15/21                    Plan - 11/23/20 1728     Clinical Impression Statement Pt presents to skilled PT due to bladder leakage and dysparuenia.  Pt had 2 vaginal deliveries with large babies 9+ and 10+ lbs.  Pt has some reduced levator strength that appears to be from levator tearing during deliveries.  Pt has DRA 1-2 fingers with 2 fingers at umbilicus.  Pt has MMT pelvic floor of 1/5 for 1 sec hold.  pt can only do 3 quick flicks . Pt has some mild h/s tension . anterior pelvic tilt and tigth lumbar erectors in standing.  She has prolapse of anterior and posterior wall as well as uterine.  Pt will benefit from skilled PT to address impairments and restore maximum function.    Personal Factors and Comorbidities Time since onset of injury/illness/exacerbation;Comorbidity 1    Comorbidities 2  large babies delivered vaginally    Examination-Activity Limitations Continence;Toileting    Examination-Participation Restrictions Community Activity    Stability/Clinical Decision Making Evolving/Moderate complexity    Clinical Decision Making Moderate    Rehab Potential Excellent    PT Frequency 1x / week    PT Duration 12 weeks    PT Treatment/Interventions ADLs/Self Care Home Management;Biofeedback;Moist Heat;Cryotherapy;Electrical Stimulation;Therapeutic activities;Taping;Dry needling;Therapeutic exercise;Neuromuscular re-education;Patient/family education;Manual techniques    PT Next Visit Plan lumbar and h/s stretch and isolate pelvic floor; find out of she purchased kegel weights, biofeedback, discuss moisurizer or coconut oil for intercourse    Consulted and Agree with Plan of Care Patient             Patient will benefit from skilled therapeutic intervention in order to improve the following deficits and impairments:  Decreased strength, Decreased coordination, Decreased activity tolerance, Decreased endurance, Impaired flexibility, Postural dysfunction, Pain, Increased muscle spasms  Visit Diagnosis: No diagnosis found.     Problem List Patient Active Problem List   Diagnosis Date Noted   Encounter to establish care 09/04/2020   Stress incontinence 09/04/2020   Screening for iron deficiency anemia 09/04/2020   Screening for lipid disorders 09/04/2020   Screening for endocrine, nutritional, metabolic and immunity disorder 09/04/2020   Elevated blood pressure reading 09/04/2020   Encounter for annual physical exam 09/04/2020   Need for Tdap vaccination 09/04/2020   Gastroesophageal reflux disease without esophagitis 08/10/2015   ANEMIA 12/21/2009    Jule Ser, PT 11/23/2020, 5:45 PM  [facility] [address], [place], [place], [address] Phone: [phone]   Fax:  [phone]  Name: Lillianna Ephraim MRN: [id number] Date of Birth: 1975/12/10

## 2020-12-22 ENCOUNTER — Ambulatory Visit (INDEPENDENT_AMBULATORY_CARE_PROVIDER_SITE_OTHER): Payer: BC Managed Care – PPO | Admitting: Adult Health

## 2020-12-22 ENCOUNTER — Other Ambulatory Visit: Payer: Self-pay

## 2020-12-22 ENCOUNTER — Encounter: Payer: Self-pay | Admitting: Adult Health

## 2020-12-22 VITALS — BP 154/92 | HR 72 | Ht 65.5 in | Wt 166.0 lb

## 2020-12-22 DIAGNOSIS — N816 Rectocele: Secondary | ICD-10-CM | POA: Diagnosis not present

## 2020-12-22 DIAGNOSIS — N3946 Mixed incontinence: Secondary | ICD-10-CM | POA: Insufficient documentation

## 2020-12-22 DIAGNOSIS — N814 Uterovaginal prolapse, unspecified: Secondary | ICD-10-CM | POA: Diagnosis not present

## 2020-12-22 DIAGNOSIS — Z3041 Encounter for surveillance of contraceptive pills: Secondary | ICD-10-CM

## 2020-12-22 DIAGNOSIS — Z1211 Encounter for screening for malignant neoplasm of colon: Secondary | ICD-10-CM

## 2020-12-22 DIAGNOSIS — Z01419 Encounter for gynecological examination (general) (routine) without abnormal findings: Secondary | ICD-10-CM | POA: Diagnosis not present

## 2020-12-22 LAB — HEMOCCULT GUIAC POC 1CARD (OFFICE): Fecal Occult Blood, POC: NEGATIVE

## 2020-12-22 MED ORDER — CRYSELLE-28 0.3-30 MG-MCG PO TABS: 1.0000 | ORAL_TABLET | Freq: Every day | ORAL | 4 refills | Status: DC

## 2020-12-22 NOTE — Progress Notes (Signed)
Patient ID: Mikayla Snow, female   DOB: [birth year], 45 y.o.   MRN: [id number] History of Present Illness: Mikayla Snow is a 45 year old white female,married, G2P2002, in for a well woman GYN exam. Her PCP stopped her BP meds, but she has gained some weight and had salt this summer and BP up a little, but she is doing better now. She teaches at [employer].  Lab Results  Component Value Date   DIAGPAP  07/08/2019    - Negative for intraepithelial lesion or malignancy (NILM)   HPV NOT DETECTED 02/02/2017   [place] Negative 07/08/2019    PCP is Jacolyn Reedy NP .  Current Medications, Allergies, Past Medical History, Past Surgical History, Family History and Social History were reviewed in [employer] record.     Review of Systems: Patient denies any headaches, hearing loss, fatigue, blurred vision, shortness of breath, chest pain, abdominal pain, problems with bowel movements, urination(has mixed UI at times), or intercourse. No joint pain or mood swings. Periods good on OCs   Physical Exam:BP (!) 154/92 (BP Location: Left Arm, Patient Position: Sitting, Cuff Size: Normal)   Pulse 72   Ht 5' 5.5" (1.664 m)   Wt 166 lb (75.3 kg)   LMP 12/03/2020   BMI 27.20 kg/m   General:  Well developed, well nourished, no acute distress Skin:  Warm and dry Neck:  Midline trachea, normal thyroid, good ROM, no lymphadenopathy Lungs; Clear to auscultation bilaterally Breast:  No dominant palpable mass, retraction, or nipple discharge Cardiovascular: Regular rate and rhythm Abdomen:  Soft, non tender, no hepatosplenomegaly Pelvic:  External genitalia is normal in appearance, no lesions.  The vagina is normal in appearance. +cystocele and pelvic relation.Urethra has no lesions or masses. The cervix is bulbous.  Uterus is felt to be normal size, shape, and contour.  No adnexal masses or tenderness noted.Bladder is non tender, no masses felt. Rectal: Good sphincter tone, no polyps, or hemorrhoids  felt.  Hemoccult negative.+rectocele  Extremities/musculoskeletal:  No swelling or varicosities noted, no clubbing or cyanosis Psych:  No mood changes, alert and cooperative,seems happy AA is 1 Fall risk is low Depression screen [facility] 2/9 12/22/2020 09/04/2020 09/02/2019  Decreased Interest 0 0 0  Down, Depressed, Hopeless 0 0 0  PHQ - 2 Score 0 0 0  Altered sleeping 0 0 -  Tired, decreased energy 0 1 -  Change in appetite 0 1 -  Feeling bad or failure about yourself  0 0 -  Trouble concentrating 0 0 -  Moving slowly or fidgety/restless 0 0 -  Suicidal thoughts 0 0 -  PHQ-9 Score 0 2 -  Difficult doing work/chores - Not difficult at all -    GAD 7 : Generalized Anxiety Score 12/22/2020 09/04/2020  Nervous, Anxious, on Edge 0 1  Control/stop worrying 0 0  Worry too much - different things 0 0  Trouble relaxing 0 0  Restless 0 0  Easily annoyed or irritable 0 0  Afraid - awful might happen 0 0  Total GAD 7 Score 0 1  Anxiety Difficulty - Not difficult at all    Examination chaperoned by Celene Squibb LPN   Impression and Plan: 1. Encounter for well woman exam with routine gynecological exam Physical in 1 year Pap in 2024 Mammogram now Labs with PCP  Had colonoscopy last year   2. Encounter for screening fecal occult blood testing   3. Cystocele with uterine descensus Go back to PT for pelvic floor  4. Rectocele   5. Encounter for surveillance of contraceptive pills Continue Cryselle Meds ordered this encounter  Medications   norgestrel-ethinyl estradiol (CRYSELLE-28) 0.3-30 MG-MCG tablet    Sig: Take 1 tablet by mouth daily.    Dispense:  84 tablet    Refill:  4    Order Specific Question:   Supervising Provider    Answer:   Tania Ade H [[id number]]    Keep check on BP, if continues up, see PCP  6. Mixed stress and urge urinary incontinence

## 2021-01-06 ENCOUNTER — Encounter: Payer: BC Managed Care – PPO | Admitting: Physical Therapy

## 2021-04-20 ENCOUNTER — Encounter (HOSPITAL_BASED_OUTPATIENT_CLINIC_OR_DEPARTMENT_OTHER): Payer: Self-pay | Admitting: Nurse Practitioner

## 2021-04-21 ENCOUNTER — Other Ambulatory Visit (HOSPITAL_BASED_OUTPATIENT_CLINIC_OR_DEPARTMENT_OTHER): Payer: Self-pay | Admitting: Nurse Practitioner

## 2021-04-21 DIAGNOSIS — I10 Essential (primary) hypertension: Secondary | ICD-10-CM

## 2021-04-21 MED ORDER — LISINOPRIL 10 MG PO TABS: 10.0000 mg | ORAL_TABLET | Freq: Every day | ORAL | 3 refills | Status: DC

## 2021-05-18 IMAGING — MG DIGITAL SCREENING BILATERAL MAMMOGRAM WITH TOMO AND CAD
8 series · 8 of 24 positions shown · non-contrast
Comparison: Previous exam(s).

CLINICAL DATA: Screening.

EXAM:
DIGITAL SCREENING BILATERAL MAMMOGRAM WITH TOMO AND CAD

[L CC synth-2D]
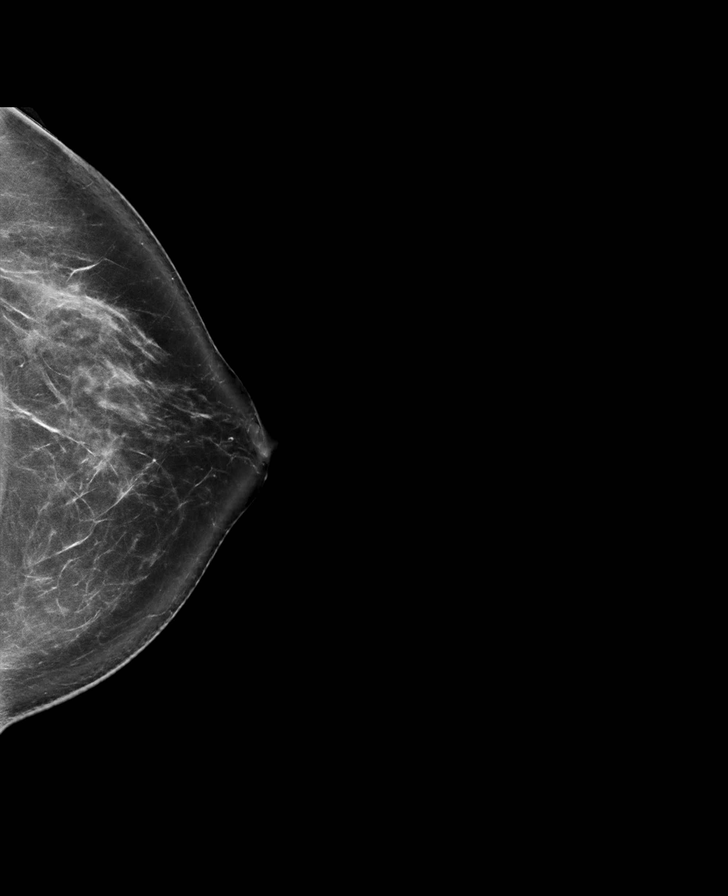

[R CC synth-2D]
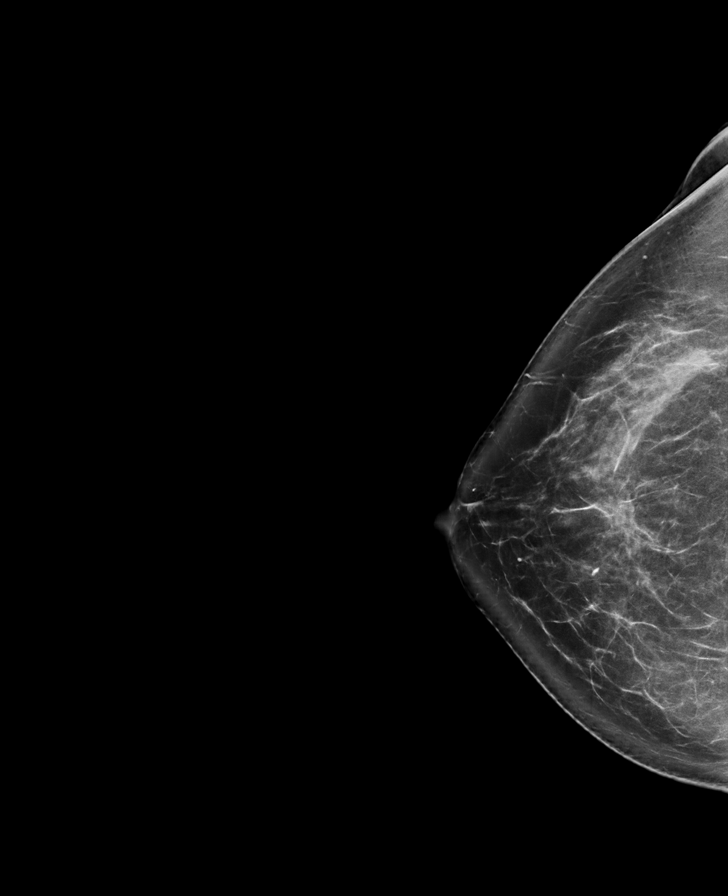

[R MLO synth-2D]
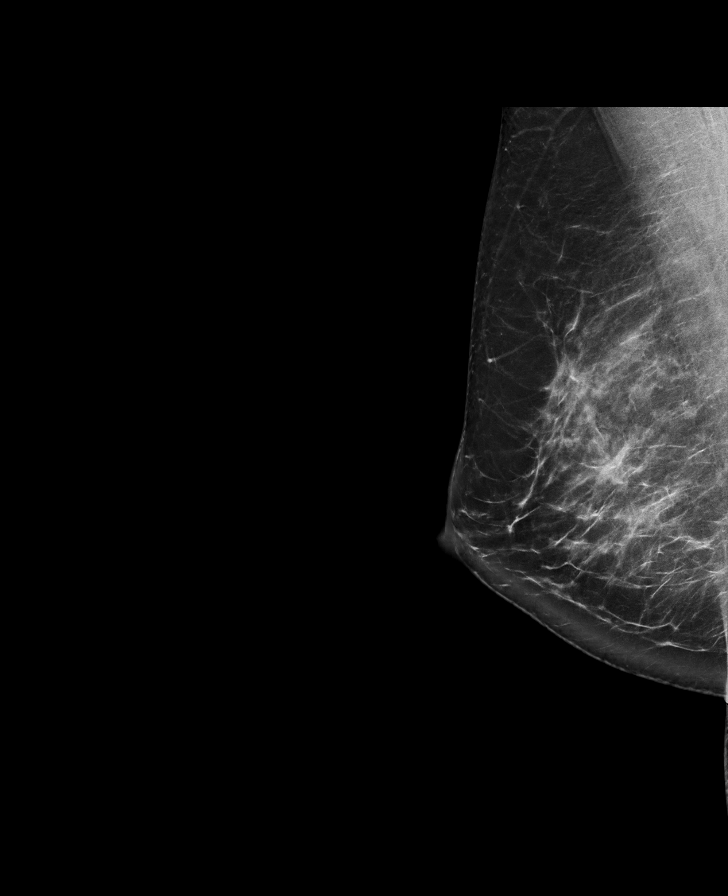

[L MLO synth-2D]
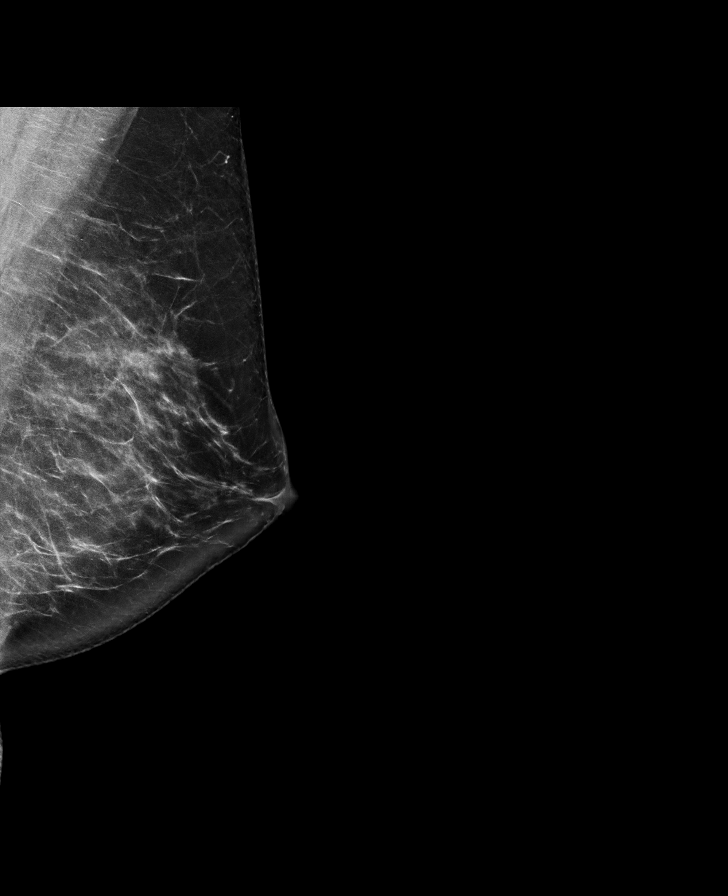

[L MLO tomo · tomo slice 43/84.0]
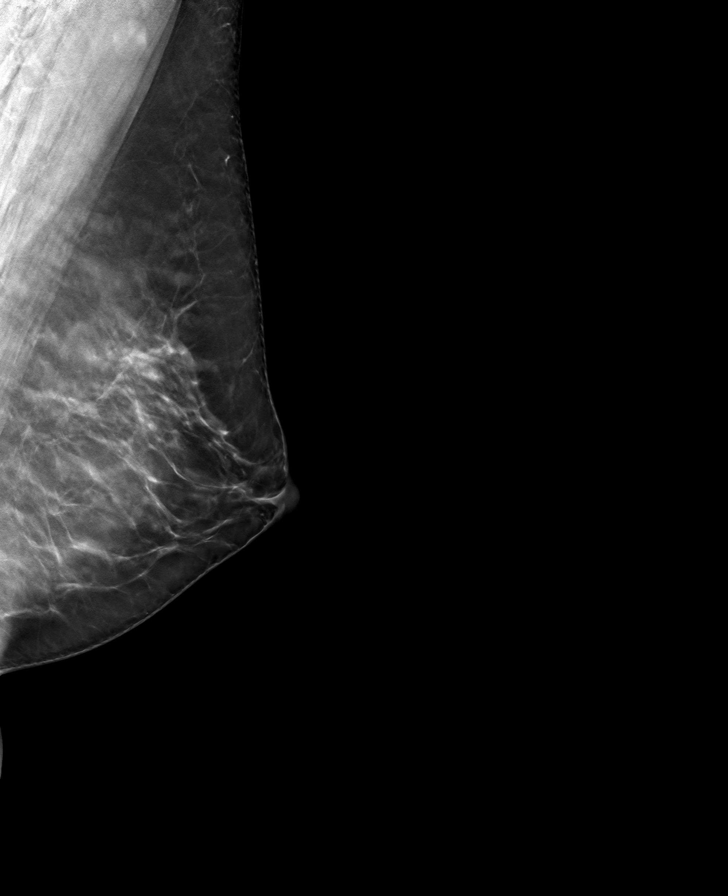

[R CC tomo · tomo slice 45/88.0]
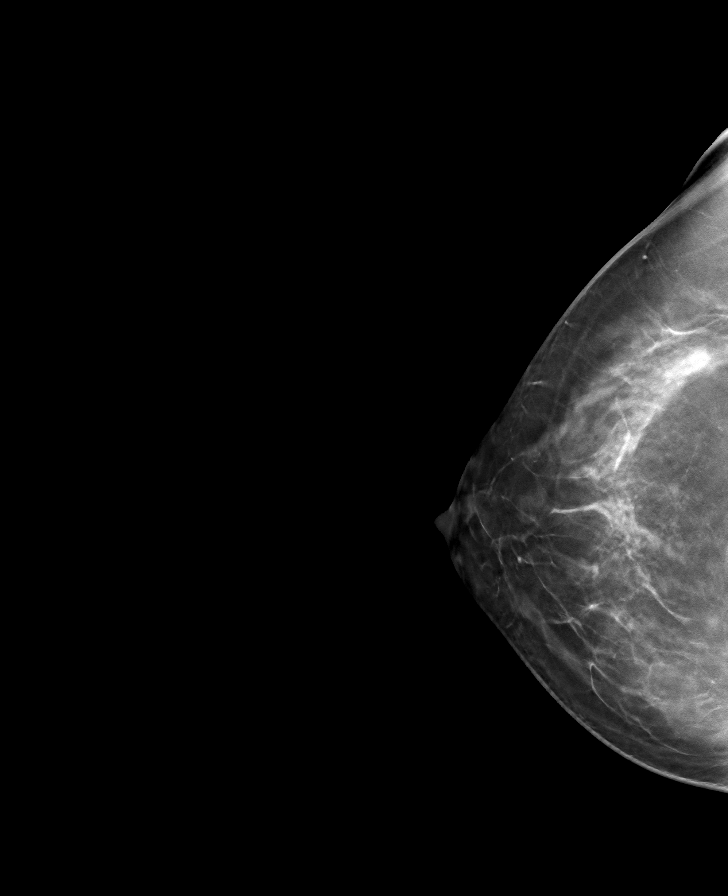

[R MLO tomo · tomo slice 45/89.0]
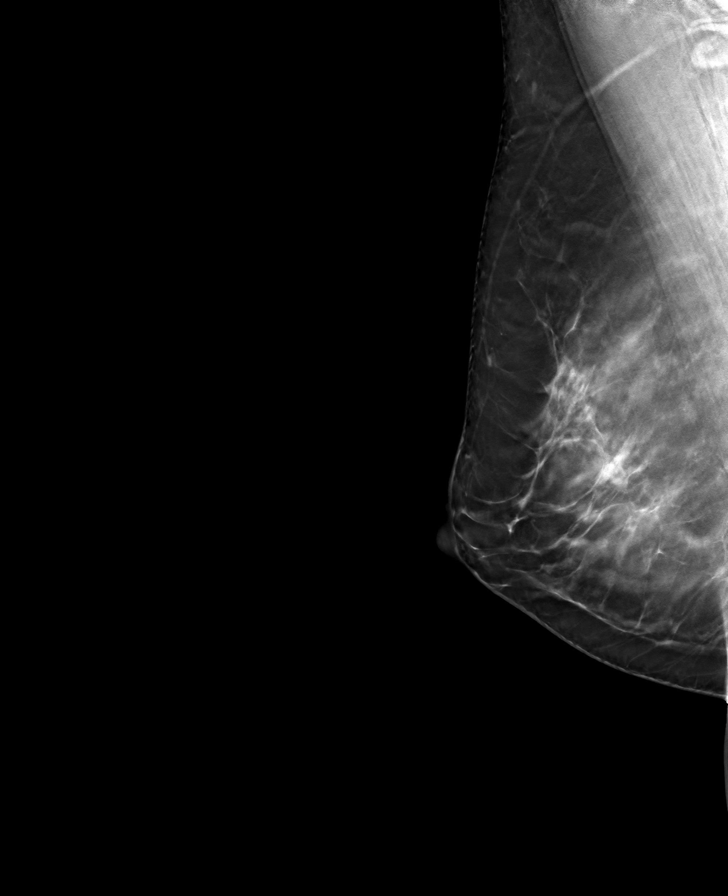

[L CC tomo · tomo slice 45/88.0]
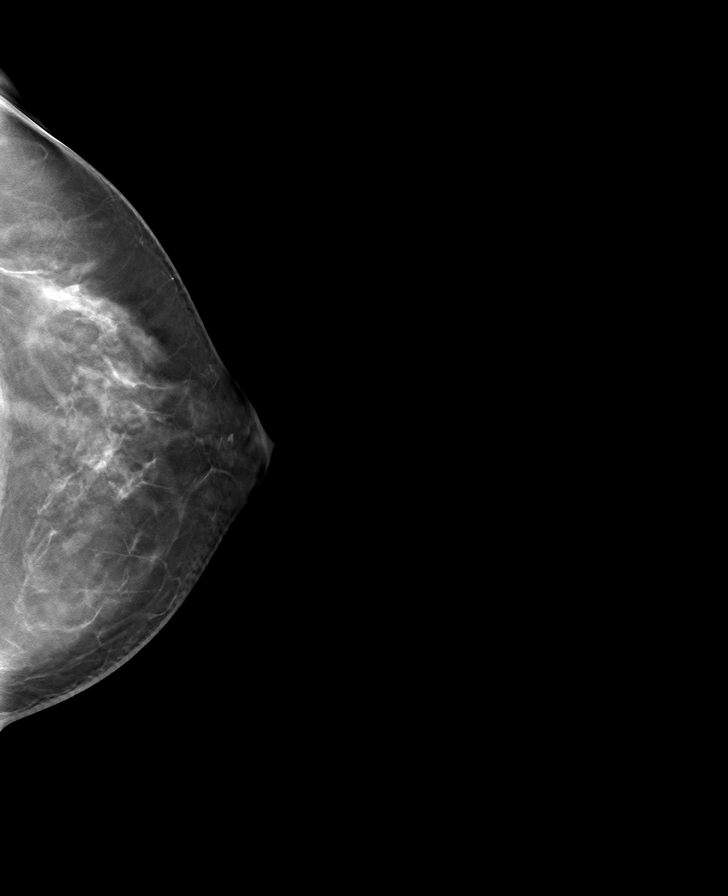

[8 of 24 positions shown; findings below may reference images not displayed]

ACR Breast Density Category c: The breast tissue is heterogeneously
dense, which may obscure small masses.
FINDINGS: There are no findings suspicious for malignancy. Images were
processed with CAD.
IMPRESSION: No mammographic evidence of malignancy. A result letter of this
screening mammogram will be mailed directly to the patient.

RECOMMENDATION:
Screening mammogram in one year. (Code:[id number])

BI-RADS CATEGORY  1: Negative.

## 2021-09-06 ENCOUNTER — Ambulatory Visit (INDEPENDENT_AMBULATORY_CARE_PROVIDER_SITE_OTHER): Payer: BC Managed Care – PPO | Admitting: Nurse Practitioner

## 2021-09-06 ENCOUNTER — Encounter (HOSPITAL_BASED_OUTPATIENT_CLINIC_OR_DEPARTMENT_OTHER): Payer: Self-pay | Admitting: Nurse Practitioner

## 2021-09-06 VITALS — BP 117/72 | Ht 65.0 in | Wt 162.6 lb

## 2021-09-06 DIAGNOSIS — Z1231 Encounter for screening mammogram for malignant neoplasm of breast: Secondary | ICD-10-CM

## 2021-09-06 DIAGNOSIS — I10 Essential (primary) hypertension: Secondary | ICD-10-CM | POA: Diagnosis not present

## 2021-09-06 DIAGNOSIS — E782 Mixed hyperlipidemia: Secondary | ICD-10-CM

## 2021-09-06 DIAGNOSIS — Z0000 Encounter for general adult medical examination without abnormal findings: Secondary | ICD-10-CM | POA: Diagnosis not present

## 2021-09-06 DIAGNOSIS — R42 Dizziness and giddiness: Secondary | ICD-10-CM | POA: Insufficient documentation

## 2021-09-06 DIAGNOSIS — H9201 Otalgia, right ear: Secondary | ICD-10-CM | POA: Diagnosis not present

## 2021-09-06 DIAGNOSIS — Z7689 Persons encountering health services in other specified circumstances: Secondary | ICD-10-CM

## 2021-09-06 NOTE — Assessment & Plan Note (Signed)
Chronic.  Blood pressure well controlled in the office today and on checks at home. ?Intermittent dizzy spells do not appear related to blood pressure as this is normal when she had experienced them ?No alarm symptoms present today.  Plan to continue current medication.  We will obtain labs today for further evaluation and make recommendations changes in plan of care based on labs. ?

## 2021-09-06 NOTE — Assessment & Plan Note (Signed)
Intermittent mild dizzy spells typically in the mornings.  Blood pressure has been checked during these episodes and is normal.  Patient has recently stopped eating breakfast but does drink coffee with creamer.  Suspect that blood sugar is dropping slightly after the spike with creamer and no protein to sustain.  Encourage patient to eat small amount of protein in the morning to help stabilize blood sugars.  If symptoms continue please let us know. ?

## 2021-09-06 NOTE — Assessment & Plan Note (Addendum)
Intermittent stabbing right ear pain lasting only a few minutes.  This has been ongoing for quite some time now.  Discussed the option of ENT referral with patient however at this time she would like to monitor to see if symptoms worsen as this is not very bothersome.  No other associated symptoms.  Ears clear today. ?Recommend reaching out if symptoms worsen or become bothersome and we will be happy to send referral for her. ?

## 2021-09-06 NOTE — Progress Notes (Signed)
? ?BP 117/72   Ht '5\' 5"'$  (1.651 m)   Wt 162 lb 9.6 oz (73.8 kg)   BMI 27.06 kg/m?   ? ?Subjective:  ? ? Patient ID: Mikayla Snow, female    DOB: [birth year], 46 y.o.   MRN: [id number] ? ?HPI: ?Mikayla Snow is a 46 y.o. female presenting on 09/06/2021 for comprehensive medical examination.  ? ?Current medical concerns include: ?BP has been good on lisinopril ?Very mild dizzy spells- BP checked with episode and this was normal ?No weakness or other symptoms. Taking zyrtec daily.  ?Symptoms only last for a few minutes and then resolve spontaneously ?Has not been consistent about eating breakfast regularly- does drink coffee ?Feels that these have started since not eating breakfast routinely ? ?Stabbing pain in right ear on occasion lasts 30 minutes or less, occurs rarely ?Has been present for years ?Not getting worse ?Hearing test in the past normal ? ?She reports regular vision exams q1-5y: yes ?She reports regular dental exams q 5m yes ?Her diet consists of:  overall healthy ?She endorses exercise and/or activity of:  moves a lot while working- occasional outside physical activity - at least 10,000 steps a day ?She works in:  MArt therapist? ?She endorses ETOH use ( once every couple of months ) ?She denies nictoine use  ?She denies illegal substance use  ? ?She reports regular menstrual periods. No menopausal symptoms ?She is currently sexually active with [facility] ?She denies concerns today about STI ? ?She denies concerns about skin changes today  ?She denies concerns about bowel changes today  ?She denies concerns about bladder changes today  ? ?Most Recent Depression Screen:  ? ?  12/22/2020  ?  3:39 PM 09/04/2020  ?  8:44 AM 09/02/2019  ?  9:40 AM 07/08/2019  ?  3:48 PM 05/16/2017  ?  8:40 AM  ?Depression screen PHQ 2/9  ?Decreased Interest 0 0 0 0 0  ?Down, Depressed, Hopeless 0 0 0 0 0  ?PHQ - 2 Score 0 0 0 0 0  ?Altered sleeping 0 0     ?Tired, decreased energy 0 1     ?Change in appetite 0 1     ?Feeling bad or  failure about yourself  0 0     ?Trouble concentrating 0 0     ?Moving slowly or fidgety/restless 0 0     ?Suicidal thoughts 0 0     ?PHQ-9 Score 0 2     ?Difficult doing work/chores  Not difficult at all     ? ?Most Recent Anxiety Screen:  ? ?  12/22/2020  ?  3:40 PM 09/04/2020  ?  8:45 AM  ?GAD 7 : Generalized Anxiety Score  ?Nervous, Anxious, on Edge 0 1  ?Control/stop worrying 0 0  ?Worry too much - different things 0 0  ?Trouble relaxing 0 0  ?Restless 0 0  ?Easily annoyed or irritable 0 0  ?Afraid - awful might happen 0 0  ?Total GAD 7 Score 0 1  ?Anxiety Difficulty  Not difficult at all  ? ?Most Recent Fall Screen: ? ?  09/06/2021  ?  8:25 AM 12/22/2020  ?  3:39 PM 09/04/2020  ?  9:34 AM 09/04/2020  ?  8:44 AM 07/08/2019  ?  3:48 PM  ?Fall Risk   ?Falls in the past year? 0 0 0 0 0  ?Number falls in past yr: 0 0 0 0 0  ?Injury with Fall? 0 0 0 0  0  ?Risk for fall due to : No Fall Risks No Fall Risks No Fall Risks No Fall Risks   ?Follow up Falls evaluation completed;Education provided Falls evaluation completed Falls evaluation completed Falls evaluation completed Falls evaluation completed  ? ? ?All ROS negative except what is listed above and in the HPI.  ? ?Past medical history, surgical history, medications, allergies, family history and social history reviewed with patient today and changes made to appropriate areas of the chart.  ?Past Medical History:  ?Past Medical History:  ?Diagnosis Date  ? Allergy   ? seasonal allergies  ? AMA (advanced maternal age) multigravida 72+   ? Anemia   ? on meds  ? Esophageal stricture   ? Essential hypertension 02/03/2017  ? GERD (gastroesophageal reflux disease)   ? on meds  ? History of chickenpox   ? History of hay fever   ? History of urinary incontinence   ? Hypertension   ? on meds  ? 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 ?Medications:  ?Current Outpatient Medications on File Prior to Visit  ?Medication Sig  ? CALCIUM PO Take by mouth daily.  ? cetirizine  (ZYRTEC) 10 MG tablet Take 10 mg by mouth daily.  ? ferrous sulfate 325 (65 FE) MG tablet Take 325 mg by mouth daily with breakfast.  ? fluticasone (FLONASE) 50 MCG/ACT nasal spray Place 2 sprays into both nostrils as needed for allergies or rhinitis.  ? lisinopril (ZESTRIL) 10 MG tablet Take 1 tablet (10 mg total) by mouth daily.  ? norgestrel-ethinyl estradiol (CRYSELLE-28) 0.3-30 MG-MCG tablet Take 1 tablet by mouth daily.  ? omeprazole (PRILOSEC) 20 MG capsule TAKE ONE CAPSULE BY MOUTH EVERY DAY 30 MINUTES BEFORE MEAL  ? VITAMIN D PO Take by mouth.  ? ?No current facility-administered medications on file prior to visit.  ? ?Surgical History:  ?Past Surgical History:  ?Procedure Laterality Date  ? COLONOSCOPY  02/07/2020  ? WISDOM TOOTH EXTRACTION    ? ?Allergies:  ?No Known Allergies ?Social History:  ?Social History  ? ?Socioeconomic History  ? Marital status: Married  ?  Spouse name: Not on file  ? Number of children: Not on file  ? Years of education: Not on file  ? Highest education level: Not on file  ?Occupational History  ? Not on file  ?Tobacco Use  ? Smoking status: Never  ? Smokeless tobacco: Never  ?Vaping Use  ? Vaping Use: Never used  ?Substance and Sexual Activity  ? Alcohol use: Not Currently  ?  Alcohol/week: 0.0 standard drinks  ?  Comment: occ  ? Drug use: No  ? Sexual activity: Yes  ?  Partners: Male  ?  Birth control/protection: Pill  ?Other Topics Concern  ? Not on file  ?Social History Narrative  ? Not on file  ? ?Social Determinants of Health  ? ?Financial Resource Strain: Low Risk   ? Difficulty of Paying Living Expenses: Not hard at all  ?Food Insecurity: No Food Insecurity  ? Worried About Charity fundraiser in the Last Year: Never true  ? Ran Out of Food in the Last Year: Never true  ?Transportation Needs: No Transportation Needs  ? Lack of Transportation (Medical): No  ? Lack of Transportation (Non-Medical): No  ?Physical Activity: Insufficiently Active  ? Days of Exercise per Week:  6 days  ? Minutes of Exercise per Session: 20 min  ?Stress: No Stress Concern Present  ? Feeling of Stress : Only  a little  ?Social Connections: Socially Integrated  ? Frequency of Communication with Friends and Family: More than three times a week  ? Frequency of Social Gatherings with Friends and Family: Once a week  ? Attends Religious Services: More than 4 times per year  ? Active Member of Clubs or Organizations: Yes  ? Attends Archivist Meetings: More than 4 times per year  ? Marital Status: Married  ?Intimate Partner Violence: Not At Risk  ? Fear of Current or Ex-Partner: No  ? Emotionally Abused: No  ? Physically Abused: No  ? Sexually Abused: No  ? ?Social History  ? ?Tobacco Use  ?Smoking Status Never  ?Smokeless Tobacco Never  ? ?Social History  ? ?Substance and Sexual Activity  ?Alcohol Use Not Currently  ? Alcohol/week: 0.0 standard drinks  ? Comment: occ  ? ?Family History:  ?Family History  ?Problem Relation Age of Onset  ? Hypertension Mother   ? Depression Mother   ? Arthritis Paternal Grandmother   ?     severe in hands  ? Stroke Paternal Grandfather   ? Hypertension Father   ? Prostate cancer Father 70  ?     [place] chemical exposure  ? Colon polyps Neg Hx   ? Colon cancer Neg Hx   ? Esophageal cancer Neg Hx   ? Stomach cancer Neg Hx   ? Rectal cancer Neg Hx   ? ? ?   ?Objective:  ?  ?BP 117/72   Ht '5\' 5"'$  (1.651 m)   Wt 162 lb 9.6 oz (73.8 kg)   BMI 27.06 kg/m?   ?Wt Readings from Last 3 Encounters:  ?09/06/21 162 lb 9.6 oz (73.8 kg)  ?12/22/20 166 lb (75.3 kg)  ?09/04/20 153 lb 9.6 oz (69.7 kg)  ?  ?Physical Exam ?Vitals and nursing note reviewed.  ?Constitutional:   ?   General: She is not in acute distress. ?   Appearance: Normal appearance.  ?HENT:  ?   Head: Normocephalic and atraumatic.  ?   Right Ear: Hearing, tympanic membrane, ear canal and external ear normal.  ?   Left Ear: Hearing, tympanic membrane, ear canal and external ear normal.  ?   Nose: Nose normal.  ?   Right  Sinus: No maxillary sinus tenderness or frontal sinus tenderness.  ?   Left Sinus: No maxillary sinus tenderness or frontal sinus tenderness.  ?   Mouth/Throat:  ?   Lips: Pink.  ?   Mouth: Mucous membranes are

## 2021-09-06 NOTE — Patient Instructions (Signed)
It was a pleasure seeing you today. I hope your time spent with [place] was pleasant and helpful. Please let us know if there is anything we can do to improve the service you receive.  ? ?Today we discussed concerns with: ? ?- Physical Exam ?- I have ordered the mammogram for you, they will call you to schedule ?- I will let you know what your labs show ? ? ?The following orders have been placed for you today: ? ?No orders of the defined types were placed in this encounter. ? ? ? ?Important Office Information ?Lab Results ?If labs were ordered, please note that you will see results through [place] as soon as they come available from [facility].  ?It takes up to 5 business days for the results to be routed to me and for me to review them once all of the lab results have come through from [facility] I will make recommendations based on your results and send these through [place] or someone from the office will call you to discuss. If your labs are abnormal, we may contact you to schedule a visit to discuss the results and make recommendations.  ?If you have not heard from [place] within 5 business days or you have waited longer than a week and your lab results have not come through on [place], please feel free to call the office or send a message through [place] to follow-up on these labs.  ? ?Referrals ?If referrals were placed today, the office where the referral was sent will contact you either by phone or through [place] to set up scheduling. Please note that it can take up to a week for the referral office to contact you. If you do not hear from them in a week, please contact the referral office directly to inquire about scheduling.  ? ?Condition Treated ?If your condition worsens or you begin to have new symptoms, please schedule a follow-up appointment for further evaluation. If you are not sure if an appointment is needed, you may call the office to leave a message for the nurse and someone will contact you with  recommendations.  ?If you have an urgent or life threatening emergency, please do not call the office, but seek emergency evaluation by calling 911 or going to the nearest emergency room for evaluation.  ? ?MyChart and Phone Calls ?Please do not use MyChart for urgent messages. It may take up to 3 business days for MyChart messages to be read by staff and if they are unable to handle the request, an additional 3 business days for them to be routed to me and for my response.  ?Messages sent to the provider through [place] do not come directly to the provider, please allow time for these messages to be routed and for me to respond.  ?We get a large volume of MyChart messages daily and these are responded to in the order received.  ? ?For urgent messages, please call the office at [phone] and speak with the front office staff or leave a message on the line of my assistant for guidance.  ?We are seeing patients from the hours of 8:00 am through 5:00 pm and calls directly to the nurse may not be answered immediately due to seeing patients, but your call will be returned as soon as possible.  ?Phone  messages received after 4:00 PM Monday through Thursday may not be returned until the following business day. Phone messages received after 11:00 AM on Friday may not be returned  until Monday.  ? ?After Hours ?We share on call hours with providers from other offices. If you have an urgent need after hours that cannot wait until the next business day, please contact the on call provider by calling the office number. A nurse will speak with you and contact the provider if needed for recommendations.  ?If you have an urgent or life threatening emergency after hours, please do not call the on call provider, but seek emergency evaluation by calling 911 or going to the nearest emergency room for evaluation.  ? ?Paperwork ?All paperwork requires a minimum of 5 days to complete and return to you or the designated personnel.  Please keep this in mind when bringing in forms or sending requests for paperwork completion to the office.  ?  ?

## 2021-09-13 LAB — COMPREHENSIVE METABOLIC PANEL
ALT: 7 IU/L (ref 0–32)
AST: 14 IU/L (ref 0–40)
Albumin/Globulin Ratio: 1.7 (ref 1.2–2.2)
Albumin: 4.5 g/dL (ref 3.8–4.8)
Alkaline Phosphatase: 40 IU/L — ABNORMAL LOW (ref 44–121)
BUN/Creatinine Ratio: 9 (ref 9–23)
BUN: 8 mg/dL (ref 6–24)
Bilirubin Total: <0.2 mg/dL (ref 0.0–1.2)
CO2: 21 mmol/L (ref 20–29)
Calcium: 9.5 mg/dL (ref 8.7–10.2)
Chloride: 105 mmol/L (ref 96–106)
Creatinine, Ser: 0.86 mg/dL (ref 0.57–1.00)
Globulin, Total: 2.7 g/dL (ref 1.5–4.5)
Glucose: 93 mg/dL (ref 70–99)
Potassium: 4.7 mmol/L (ref 3.5–5.2)
Sodium: 140 mmol/L (ref 134–144)
Total Protein: 7.2 g/dL (ref 6.0–8.5)
eGFR: 84 mL/min/{1.73_m2} (ref >59)

## 2021-09-13 LAB — CBC WITH DIFFERENTIAL/PLATELET
Basophils Absolute: 0.1 10*3/uL (ref 0.0–0.2)
Basos: 1 %
EOS (ABSOLUTE): 0.3 10*3/uL (ref 0.0–0.4)
Eos: 5 %
Hematocrit: 37.4 % (ref 34.0–46.6)
Hemoglobin: 12 g/dL (ref 11.1–15.9)
Immature Grans (Abs): 0 10*3/uL (ref 0.0–0.1)
Immature Granulocytes: 0 %
Lymphocytes Absolute: 1.2 10*3/uL (ref 0.7–3.1)
Lymphs: 26 %
MCH: 26.4 pg — ABNORMAL LOW (ref 26.6–33.0)
MCHC: 32.1 g/dL (ref 31.5–35.7)
MCV: 82 fL (ref 79–97)
Monocytes Absolute: 0.3 10*3/uL (ref 0.1–0.9)
Monocytes: 6 %
Neutrophils Absolute: 3 10*3/uL (ref 1.4–7.0)
Neutrophils: 62 %
Platelets: 263 10*3/uL (ref 150–450)
RBC: 4.54 x10E6/uL (ref 3.77–5.28)
RDW: 13.7 % (ref 11.7–15.4)
WBC: 4.8 10*3/uL (ref 3.4–10.8)

## 2021-09-13 LAB — LIPID PANEL
Chol/HDL Ratio: 4.5 ratio — ABNORMAL HIGH (ref 0.0–4.4)
Cholesterol, Total: 208 mg/dL — ABNORMAL HIGH (ref 100–199)
HDL: 46 mg/dL (ref >39)
LDL Chol Calc (NIH): 150 mg/dL — ABNORMAL HIGH (ref 0–99)
Triglycerides: 69 mg/dL (ref 0–149)
VLDL Cholesterol Cal: 12 mg/dL (ref 5–40)

## 2021-09-13 LAB — TSH: TSH: 2.2 u[IU]/mL (ref 0.450–4.500)

## 2021-09-13 LAB — VITAMIN D 1,25 DIHYDROXY
Vitamin D 1, 25 (OH)2 Total: 111 pg/mL — ABNORMAL HIGH
Vitamin D2 1, 25 (OH)2: <10 pg/mL
Vitamin D3 1, 25 (OH)2: 109 pg/mL

## 2021-09-13 LAB — HEMOGLOBIN A1C
Est. average glucose Bld gHb Est-mCnc: 108 mg/dL
Hgb A1c MFr Bld: 5.4 % (ref 4.8–5.6)

## 2021-09-13 LAB — T4, FREE: Free T4: 1 ng/dL (ref 0.82–1.77)

## 2021-09-16 NOTE — Addendum Note (Signed)
Addended by: Danis Pembleton, Clarise Cruz E on: 09/16/2021 08:37 PM   Modules accepted: Orders

## 2021-09-17 ENCOUNTER — Telehealth (HOSPITAL_BASED_OUTPATIENT_CLINIC_OR_DEPARTMENT_OTHER): Payer: Self-pay | Admitting: Nurse Practitioner

## 2021-09-17 NOTE — Telephone Encounter (Signed)
Called pt to sch repeat labs in 6 month. Tried to leave vm but mailbox was full.

## 2021-09-17 NOTE — Telephone Encounter (Signed)
-----   Message from Orma Render, NP sent at 09/16/2021  8:37 PM EDT ----- Please call patient to schedule follow-up labs in 6 months for repeat fasting lipids and CBC.  Lab appointment only needed.

## 2021-11-03 ENCOUNTER — Ambulatory Visit
Admission: RE | Admit: 2021-11-03 | Discharge: 2021-11-03 | Disposition: A | Discharge status: Home / Self Care | Payer: BC Managed Care – PPO | Source: Ambulatory Visit | Attending: Nurse Practitioner | Admitting: Nurse Practitioner

## 2021-11-03 DIAGNOSIS — Z1231 Encounter for screening mammogram for malignant neoplasm of breast: Secondary | ICD-10-CM

## 2021-11-03 DIAGNOSIS — Z0000 Encounter for general adult medical examination without abnormal findings: Secondary | ICD-10-CM

## 2022-01-12 ENCOUNTER — Other Ambulatory Visit: Payer: Self-pay | Admitting: Adult Health

## 2022-01-12 DIAGNOSIS — Z3041 Encounter for surveillance of contraceptive pills: Secondary | ICD-10-CM

## 2022-01-17 ENCOUNTER — Other Ambulatory Visit (HOSPITAL_BASED_OUTPATIENT_CLINIC_OR_DEPARTMENT_OTHER): Payer: Self-pay | Admitting: Nurse Practitioner

## 2022-01-17 DIAGNOSIS — I10 Essential (primary) hypertension: Secondary | ICD-10-CM

## 2022-09-08 ENCOUNTER — Encounter (HOSPITAL_BASED_OUTPATIENT_CLINIC_OR_DEPARTMENT_OTHER): Payer: BC Managed Care – PPO | Admitting: Nurse Practitioner

## 2022-12-08 ENCOUNTER — Ambulatory Visit: Payer: BC Managed Care – PPO | Admitting: Adult Health

## 2023-01-27 ENCOUNTER — Encounter: Payer: Self-pay | Admitting: Adult Health

## 2023-01-27 ENCOUNTER — Other Ambulatory Visit (HOSPITAL_COMMUNITY)
Admission: RE | Admit: 2023-01-27 | Discharge: 2023-01-27 | Disposition: A | Discharge status: Home / Self Care | Payer: BC Managed Care – PPO | Source: Ambulatory Visit | Attending: Adult Health | Admitting: Adult Health

## 2023-01-27 ENCOUNTER — Ambulatory Visit (INDEPENDENT_AMBULATORY_CARE_PROVIDER_SITE_OTHER): Payer: BC Managed Care – PPO | Admitting: Adult Health

## 2023-01-27 VITALS — BP 127/77 | HR 80 | Ht 66.0 in | Wt 170.0 lb

## 2023-01-27 DIAGNOSIS — Z1211 Encounter for screening for malignant neoplasm of colon: Secondary | ICD-10-CM

## 2023-01-27 DIAGNOSIS — Z01419 Encounter for gynecological examination (general) (routine) without abnormal findings: Secondary | ICD-10-CM | POA: Insufficient documentation

## 2023-01-27 DIAGNOSIS — N816 Rectocele: Secondary | ICD-10-CM | POA: Diagnosis not present

## 2023-01-27 DIAGNOSIS — Z3041 Encounter for surveillance of contraceptive pills: Secondary | ICD-10-CM

## 2023-01-27 LAB — HEMOCCULT GUIAC POC 1CARD (OFFICE): Fecal Occult Blood, POC: NEGATIVE

## 2023-01-27 MED ORDER — CRYSELLE-28 0.3-30 MG-MCG PO TABS
1.0000 | ORAL_TABLET | Freq: Every day | ORAL | 4 refills | Status: DC
Start: 2023-01-27 — End: 2023-03-20

## 2023-01-27 NOTE — Progress Notes (Signed)
Patient ID: Mikayla Snow, female   DOB: March 11, 1976, 47 y.o.   MRN: [id number] History of Present Illness: Mikayla Snow is a 47 year old white female, married, G2P2002, in for a well woman gyn exam and pap. She is still teaching at [employer].  No current PCP   Current Medications, Allergies, Past Medical History, Past Surgical History, Family History and Social History were reviewed in [employer] record.     Review of Systems: Patient denies any headaches, hearing loss, fatigue, blurred vision, shortness of breath, chest pain, abdominal pain, problems with bowel movements, urination, or intercourse. No joint pain or mood swings.  Happy with BCP   Physical Exam:BP 127/77 (BP Location: Left Arm, Patient Position: Sitting, Cuff Size: Normal)   Pulse 80   Ht 5\' 6"  (1.676 m)   Wt 170 lb (77.1 kg)   BMI 27.44 kg/m   General:  Well developed, well nourished, no acute distress Skin:  Warm and dry Neck:  Midline trachea, normal thyroid, good ROM, no lymphadenopathy Lungs; Clear to auscultation bilaterally Breast:  No dominant palpable mass, retraction, or nipple discharge Cardiovascular: Regular rate and rhythm Abdomen:  Soft, non tender, no hepatosplenomegaly Pelvic:  External genitalia is normal in appearance, no lesions.  The vagina is normal in appearance. Urethra has no lesions or masses. The cervix is everted at os, has several nabothian cysts, pap with HR HPV genotyping performed.  Uterus is felt to be normal size, shape, and contour.  No adnexal masses or tenderness noted.Bladder is non tender, no masses felt. Rectal: Good sphincter tone, no polyps, or hemorrhoids felt.  Hemoccult negative.+rectocele Extremities/musculoskeletal:  No swelling or varicosities noted, no clubbing or cyanosis Psych:  No mood changes, alert and cooperative,seems happy AA is 1 Fall risk is low    01/27/2023    8:31 AM 12/22/2020    3:39 PM 09/04/2020    8:44 AM  Depression screen PHQ 2/9   Decreased Interest 0 0 0  Down, Depressed, Hopeless 0 0 0  PHQ - 2 Score 0 0 0  Altered sleeping 1 0 0  Tired, decreased energy 1 0 1  Change in appetite 0 0 1  Feeling bad or failure about yourself  0 0 0  Trouble concentrating 0 0 0  Moving slowly or fidgety/restless 0 0 0  Suicidal thoughts 0 0 0  PHQ-9 Score 2 0 2  Difficult doing work/chores   Not difficult at all       01/27/2023    8:31 AM 12/22/2020    3:40 PM 09/04/2020    8:45 AM  GAD 7 : Generalized Anxiety Score  Nervous, Anxious, on Edge 0 0 1  Control/stop worrying 0 0 0  Worry too much - different things 0 0 0  Trouble relaxing 0 0 0  Restless 0 0 0  Easily annoyed or irritable 0 0 0  Afraid - awful might happen 0 0 0  Total GAD 7 Score 0 0 1  Anxiety Difficulty   Not difficult at all    [place] - 01/27/23 9147       Pregnancy Intention Screening   Does the patient want to become pregnant in the next year? No    Does the patient's partner want to become pregnant in the next year? No    Would the patient like to discuss contraceptive options today? No      Contraception Wrap Up   Current Method Oral Contraceptive    End Method Oral  Contraceptive    Contraception Counseling Provided No              Examination chaperoned by Tish RN   Impression and Plan: 1. Encounter for gynecological examination with Papanicolaou smear of cervix Pap sent Pap in 3 years if normal Physical in 1 year Mammogram was negative 11/03/21, pt to schedule  - Cytology - PAP( [facility]) Colonoscopy per GI Gave name for [facility] for PCP option, she prefers female providers   2. Encounter for screening fecal occult blood testing Hemoccult was negative   3. Rectocele  4. Encounter for surveillance of contraceptive pills Happy with BCP will refill  - norgestrel-ethinyl estradiol (CRYSELLE-28) 0.3-30 MG-MCG tablet; Take 1 tablet by mouth daily.  Dispense: 84 tablet; Refill: 4

## 2023-01-30 LAB — CYTOLOGY - PAP
Comment: NEGATIVE
Diagnosis: NEGATIVE
High risk HPV: NEGATIVE

## 2023-02-10 ENCOUNTER — Telehealth: Payer: Self-pay | Admitting: Family Medicine

## 2023-02-10 NOTE — Telephone Encounter (Signed)
Need approval new patient. Already scheduled through mychart.

## 2023-02-10 NOTE — Telephone Encounter (Signed)
That's fine

## 2023-03-19 ENCOUNTER — Other Ambulatory Visit: Payer: Self-pay | Admitting: Adult Health

## 2023-03-19 DIAGNOSIS — Z3041 Encounter for surveillance of contraceptive pills: Secondary | ICD-10-CM

## 2023-03-22 ENCOUNTER — Ambulatory Visit: Payer: BC Managed Care – PPO | Admitting: Family Medicine

## 2023-03-22 VITALS — BP 142/82 | HR 75 | Ht 66.0 in | Wt 173.1 lb

## 2023-03-22 DIAGNOSIS — E559 Vitamin D deficiency, unspecified: Secondary | ICD-10-CM | POA: Diagnosis not present

## 2023-03-22 DIAGNOSIS — E038 Other specified hypothyroidism: Secondary | ICD-10-CM

## 2023-03-22 DIAGNOSIS — E7849 Other hyperlipidemia: Secondary | ICD-10-CM

## 2023-03-22 DIAGNOSIS — I10 Essential (primary) hypertension: Secondary | ICD-10-CM | POA: Diagnosis not present

## 2023-03-22 DIAGNOSIS — Z23 Encounter for immunization: Secondary | ICD-10-CM

## 2023-03-22 DIAGNOSIS — Z114 Encounter for screening for human immunodeficiency virus [HIV]: Secondary | ICD-10-CM

## 2023-03-22 DIAGNOSIS — Z1159 Encounter for screening for other viral diseases: Secondary | ICD-10-CM

## 2023-03-22 DIAGNOSIS — Z1231 Encounter for screening mammogram for malignant neoplasm of breast: Secondary | ICD-10-CM

## 2023-03-22 DIAGNOSIS — R7301 Impaired fasting glucose: Secondary | ICD-10-CM

## 2023-03-22 MED ORDER — LISINOPRIL 10 MG PO TABS: 10.0000 mg | ORAL_TABLET | Freq: Every day | ORAL | 3 refills | Status: DC

## 2023-03-22 NOTE — Assessment & Plan Note (Signed)
Patient educated on CDC recommendation for the vaccine. Verbal consent was obtained from the patient, vaccine administered by nurse, no sign of adverse reactions noted at this time. Patient education on arm soreness and use of tylenol or ibuprofen for this patient  was discussed. Patient educated on the signs and symptoms of adverse effect and advise to contact the office if they occur.  

## 2023-03-22 NOTE — Progress Notes (Signed)
Established Patient Office Visit  Subjective:  Patient ID: Mikayla Snow, female    DOB: 10-06-1975  Age: 47 y.o. MRN: [id number]  CC:  Chief Complaint  Patient presents with   Establish Care    New patient, previously seen by [facility]. Needs refills.      HPI Mikayla Snow is a 47 y.o. female with past medical history of hypertension presents for f/u of  chronic medical conditions. For the details of today's visit, please refer to the assessment and plan.     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    Past Surgical History:  Procedure Laterality Date   COLONOSCOPY  02/07/2020   WISDOM TOOTH EXTRACTION      Family History  Problem Relation Age of Onset   Hypertension Mother    Depression Mother    Arthritis Paternal Grandmother        severe in hands   Stroke Paternal Grandfather    Hypertension Father    Prostate cancer Father 1       [place] chemical exposure   Colon polyps Neg Hx    Colon cancer Neg Hx    Esophageal cancer Neg Hx    Stomach cancer Neg Hx    Rectal cancer Neg Hx     Social History   Socioeconomic History   Marital status: Married    Spouse name: Not on file   Number of children: Not on file   Years of education: Not on file   Highest education level: Master's degree (e.g., MA, MS, MEng, MEd, MSW, MBA)  Occupational History   Not on file  Tobacco Use   Smoking status: Never   Smokeless tobacco: Never  Vaping Use   Vaping status: Never Used  Substance and Sexual Activity   Alcohol use: Not Currently    Alcohol/week: 0.0 standard drinks of alcohol     Comment: occ   Drug use: No   Sexual activity: Yes    Partners: Male    Birth control/protection: Pill  Other Topics Concern   Not on file  Social History Narrative   Not on file   Social Determinants of Health   Financial Resource Strain: Low Risk  (03/22/2023)   Overall Financial Resource Strain (CARDIA)    Difficulty of Paying Living Expenses: Not hard at all  Food Insecurity: No Food Insecurity (03/22/2023)   Hunger Vital Sign    Worried About Running Out of Food in the Last Year: Never true    Ran Out of Food in the Last Year: Never true  Transportation Needs: No Transportation Needs (03/22/2023)   PRAPARE - Administrator, Civil Service (Medical): No    Lack of Transportation (Non-Medical): No  Physical Activity: Insufficiently Active (03/22/2023)   Exercise Vital Sign    Days of Exercise per Week: 2 days    Minutes of Exercise per Session: 20 min  Stress: No Stress Concern Present (03/22/2023)   [employer] of Occupational Health - Occupational Stress Questionnaire  Feeling of Stress : Only a little  Social Connections: Moderately Integrated (03/22/2023)   Social Connection and Isolation Panel [NHANES]    Frequency of Communication with Friends and Family: Once a week    Frequency of Social Gatherings with Friends and Family: Once a week    Attends Religious Services: More than 4 times per year    Active Member of [employer] or Organizations: Yes    Attends Engineer, structural: More than 4 times per year    Marital Status: Married  Catering manager Violence: Not At Risk (01/27/2023)   Humiliation, Afraid, Rape, and Kick questionnaire    Fear of Current or Ex-Partner: No    Emotionally Abused: No    Physically Abused: No    Sexually Abused: No    Outpatient Medications Prior to Visit  Medication Sig Dispense Refill   CALCIUM PO Take by mouth daily.     cetirizine (ZYRTEC) 10 MG tablet Take 10 mg by mouth daily.     CRYSELLE-28 0.3-30  MG-MCG tablet TAKE 1 TABLET BY MOUTH DAILY 84 tablet 4   ferrous sulfate 325 (65 FE) MG tablet Take 325 mg by mouth daily with breakfast.     fluticasone (FLONASE) 50 MCG/ACT nasal spray Place 2 sprays into both nostrils as needed for allergies or rhinitis.     omeprazole (PRILOSEC) 20 MG capsule TAKE ONE CAPSULE BY MOUTH EVERY DAY 30 MINUTES BEFORE MEAL 30 capsule 3   VITAMIN D PO Take by mouth.     lisinopril (ZESTRIL) 10 MG tablet TAKE 1 TABLET(10 MG) BY MOUTH DAILY 90 tablet 3   No facility-administered medications prior to visit.    No Known Allergies  ROS Review of Systems  Constitutional:  Negative for chills and fever.  Eyes:  Negative for visual disturbance.  Respiratory:  Negative for chest tightness and shortness of breath.   Neurological:  Negative for dizziness and headaches.      Objective:    Physical Exam HENT:     Head: Normocephalic.     Mouth/Throat:     Mouth: Mucous membranes are moist.  Cardiovascular:     Rate and Rhythm: Normal rate.     Heart sounds: Normal heart sounds.  Pulmonary:     Effort: Pulmonary effort is normal.     Breath sounds: Normal breath sounds.  Neurological:     Mental Status: She is alert.     BP (!) 142/82   Pulse 75   Ht 5\' 6"  (1.676 m)   Wt 173 lb 1.3 oz (78.5 kg)   SpO2 92%   BMI 27.94 kg/m  Wt Readings from Last 3 Encounters:  03/22/23 173 lb 1.3 oz (78.5 kg)  01/27/23 170 lb (77.1 kg)  09/06/21 162 lb 9.6 oz (73.8 kg)    Lab Results  Component Value Date   TSH 2.200 09/06/2021   Lab Results  Component Value Date   WBC 4.8 09/06/2021   HGB 12.0 09/06/2021   HCT 37.4 09/06/2021   MCV 82 09/06/2021   PLT 263 09/06/2021   Lab Results  Component Value Date   NA 140 09/06/2021   K 4.7 09/06/2021   CO2 21 09/06/2021   GLUCOSE 93 09/06/2021   BUN 8 09/06/2021   CREATININE 0.86 09/06/2021   BILITOT <0.2 09/06/2021   ALKPHOS 40 (L) 09/06/2021   AST 14 09/06/2021   ALT 7 09/06/2021   PROT 7.2  09/06/2021   ALBUMIN 4.5 09/06/2021   CALCIUM 9.5 09/06/2021  ANIONGAP 7 09/04/2020   EGFR 84 09/06/2021   GFR 84.50 09/02/2019   Lab Results  Component Value Date   CHOL 208 (H) 09/06/2021   Lab Results  Component Value Date   HDL 46 09/06/2021   Lab Results  Component Value Date   LDLCALC 150 (H) 09/06/2021   Lab Results  Component Value Date   TRIG 69 09/06/2021   Lab Results  Component Value Date   CHOLHDL 4.5 (H) 09/06/2021   Lab Results  Component Value Date   HGBA1C 5.4 09/06/2021      Assessment & Plan:  Primary hypertension Assessment & Plan: Uncontrolled Hypertension: The patient is asymptomatic in the clinic. A low-sodium diet and increased physical activity are encouraged. The patient admits to not being adherent to the low-sodium diet or increased physical activity. The patient would like to implement lifestyle changes for one month before considering changes to her medications. She is encouraged to make lifestyle changes, record her blood pressures daily at home, and bring her ambulatory readings to her next appointment. Educated on Long-Term Considerations: Uncontrolled hypertension can increase the risk of cardiovascular diseases, including stroke, coronary artery disease, and heart failure.  Encouraged to report to the emergency department if your blood pressure exceeds 180/120 and is accompanied by symptoms such as headaches, chest pain, palpitations, blurred vision, or dizziness.   Orders: -     Lisinopril; Take 1 tablet (10 mg total) by mouth daily.  Dispense: 90 tablet; Refill: 3  Encounter for immunization Assessment & Plan: Patient educated on CDC recommendation for the vaccine. Verbal consent was obtained from the patient, vaccine administered by nurse, no sign of adverse reactions noted at this time. Patient education on arm soreness and use of tylenol or ibuprofen for this patient  was discussed. Patient educated on the signs and symptoms  of adverse effect and advise to contact the office if they occur.   Orders: -     Flu vaccine trivalent PF, 6mos and older(Flulaval,Afluria,Fluarix,Fluzone)  Breast cancer screening by mammogram -     3D Screening Mammogram, Left and Right  IFG (impaired fasting glucose) -     Hemoglobin A1c  Vitamin D deficiency -     VITAMIN D 25 Hydroxy (Vit-D Deficiency, Fractures)  Need for hepatitis C screening test -     Hepatitis C antibody  Encounter for screening for HIV -     HIV Antibody (routine testing w rflx)  TSH (thyroid-stimulating hormone deficiency) -     TSH + free T4  Other hyperlipidemia -     Lipid panel -     CMP14+EGFR -     CBC with Differential/Platelet  Note: This chart has been completed using Engineer, civil (consulting) software, and while attempts have been made to ensure accuracy, certain words and phrases may not be transcribed as intended.    Follow-up: Return in about 1 month (around 04/21/2023) for BP.   Gilmore Laroche, FNP

## 2023-03-22 NOTE — Patient Instructions (Signed)
I appreciate the opportunity to provide care to you today!    Follow up:  1 month BP  Labs: please stop by the lab during the week to get your blood drawn (CBC, CMP, TSH, Lipid profile, HgA1c, Vit D)  Screening: HIV and Hep C  Please schedule screening mammogram   Hypertension Management  Your current blood pressure is above the target goal of <140/90 mmHg. To address this, please continue taking lisinopril 10 mg daily and   Diet and Lifestyle: Adhere to a low-sodium diet, limiting intake to less than 1500 mg daily, and increase your physical activity. Hydration and Nutrition: Stay well-hydrated by drinking at least 64 ounces of water daily. Increase your servings of fruits and vegetables and avoid excessive sodium in your diet. Long-Term Considerations: Uncontrolled hypertension can increase the risk of cardiovascular diseases, including stroke, coronary artery disease, and heart failure.  Please report to the emergency department if your blood pressure exceeds 180/120 and is accompanied by symptoms such as headaches, chest pain, palpitations, blurred vision, or dizziness.   Attached with your AVS, you will find valuable resources for self-education. I highly recommend dedicating some time to thoroughly examine them.   Please continue to a heart-healthy diet and increase your physical activities. Try to exercise for at least five days a week.    It was a pleasure to see you and I look forward to continuing to work together on your health and well-being. Please do not hesitate to call the office if you need care or have questions about your care.  In case of emergency, please visit the Emergency Department for urgent care, or contact our clinic at [phone] to schedule an appointment. We're here to help you!   Have a wonderful day and week. With Gratitude, Gilmore Laroche MSN, FNP-BC

## 2023-03-22 NOTE — Assessment & Plan Note (Signed)
Uncontrolled Hypertension: The patient is asymptomatic in the clinic. A low-sodium diet and increased physical activity are encouraged. The patient admits to not being adherent to the low-sodium diet or increased physical activity. The patient would like to implement lifestyle changes for one month before considering changes to her medications. She is encouraged to make lifestyle changes, record her blood pressures daily at home, and bring her ambulatory readings to her next appointment. Educated on Long-Term Considerations: Uncontrolled hypertension can increase the risk of cardiovascular diseases, including stroke, coronary artery disease, and heart failure.  Encouraged to report to the emergency department if your blood pressure exceeds 180/120 and is accompanied by symptoms such as headaches, chest pain, palpitations, blurred vision, or dizziness.

## 2023-04-14 LAB — CMP14+EGFR
ALT: 10 [IU]/L (ref 0–32)
AST: 16 [IU]/L (ref 0–40)
Albumin: 4.2 g/dL (ref 3.9–4.9)
Alkaline Phosphatase: 42 [IU]/L — ABNORMAL LOW (ref 44–121)
BUN/Creatinine Ratio: 10 (ref 9–23)
BUN: 9 mg/dL (ref 6–24)
Bilirubin Total: 0.3 mg/dL (ref 0.0–1.2)
CO2: 23 mmol/L (ref 20–29)
Calcium: 9.2 mg/dL (ref 8.7–10.2)
Chloride: 104 mmol/L (ref 96–106)
Creatinine, Ser: 0.89 mg/dL (ref 0.57–1.00)
Globulin, Total: 2.3 g/dL (ref 1.5–4.5)
Glucose: 87 mg/dL (ref 70–99)
Potassium: 4.6 mmol/L (ref 3.5–5.2)
Sodium: 139 mmol/L (ref 134–144)
Total Protein: 6.5 g/dL (ref 6.0–8.5)
eGFR: 79 mL/min/{1.73_m2} (ref >59)

## 2023-04-14 LAB — CBC WITH DIFFERENTIAL/PLATELET
Basophils Absolute: 0 10*3/uL (ref 0.0–0.2)
Basos: 1 %
EOS (ABSOLUTE): 0.1 10*3/uL (ref 0.0–0.4)
Eos: 4 %
Hematocrit: 40.7 % (ref 34.0–46.6)
Hemoglobin: 12.9 g/dL (ref 11.1–15.9)
Immature Grans (Abs): 0 10*3/uL (ref 0.0–0.1)
Immature Granulocytes: 0 %
Lymphocytes Absolute: 1 10*3/uL (ref 0.7–3.1)
Lymphs: 27 %
MCH: 27.2 pg (ref 26.6–33.0)
MCHC: 31.7 g/dL (ref 31.5–35.7)
MCV: 86 fL (ref 79–97)
Monocytes Absolute: 0.3 10*3/uL (ref 0.1–0.9)
Monocytes: 7 %
Neutrophils Absolute: 2.3 10*3/uL (ref 1.4–7.0)
Neutrophils: 61 %
Platelets: 236 10*3/uL (ref 150–450)
RBC: 4.75 x10E6/uL (ref 3.77–5.28)
RDW: 14.3 % (ref 11.7–15.4)
WBC: 3.7 10*3/uL (ref 3.4–10.8)

## 2023-04-14 LAB — LIPID PANEL
Chol/HDL Ratio: 6 {ratio} — ABNORMAL HIGH (ref 0.0–4.4)
Cholesterol, Total: 222 mg/dL — ABNORMAL HIGH (ref 100–199)
HDL: 37 mg/dL — ABNORMAL LOW (ref >39)
LDL Chol Calc (NIH): 166 mg/dL — ABNORMAL HIGH (ref 0–99)
Triglycerides: 105 mg/dL (ref 0–149)
VLDL Cholesterol Cal: 19 mg/dL (ref 5–40)

## 2023-04-14 LAB — HIV ANTIBODY (ROUTINE TESTING W REFLEX): HIV Screen 4th Generation wRfx: NONREACTIVE

## 2023-04-14 LAB — HEMOGLOBIN A1C
Est. average glucose Bld gHb Est-mCnc: 108 mg/dL
Hgb A1c MFr Bld: 5.4 % (ref 4.8–5.6)

## 2023-04-14 LAB — HEPATITIS C ANTIBODY: Hep C Virus Ab: NONREACTIVE

## 2023-04-14 LAB — TSH+FREE T4
Free T4: 1.08 ng/dL (ref 0.82–1.77)
TSH: 2.82 u[IU]/mL (ref 0.450–4.500)

## 2023-04-14 LAB — VITAMIN D 25 HYDROXY (VIT D DEFICIENCY, FRACTURES): Vit D, 25-Hydroxy: 34 ng/mL (ref 30.0–100.0)

## 2023-04-16 NOTE — Progress Notes (Signed)
Please inform the pt that her cholesterol levels are elevated, and I recommend making lifestyle changes to improve them. These changes include avoiding simple carbohydrates such as cakes, sweet desserts, ice cream, soda (both diet and regular), sweet tea, candies, chips, cookies, store-bought juices, excessive alcohol (more than 1-2 drinks per day), lemonade, artificial sweeteners, donuts, coffee creamers, and sugar-free products. Additionally, I advise reducing herr intake of greasy, fatty foods and increasing her physical activity. These steps will help manage her cholesterol levels and support overall cardiovascular health.

## 2023-05-01 ENCOUNTER — Ambulatory Visit: Payer: BC Managed Care – PPO | Admitting: Family Medicine

## 2023-05-01 ENCOUNTER — Encounter: Payer: Self-pay | Admitting: Family Medicine

## 2023-05-01 VITALS — BP 128/82 | HR 98 | Wt 172.0 lb

## 2023-05-01 DIAGNOSIS — H9201 Otalgia, right ear: Secondary | ICD-10-CM | POA: Diagnosis not present

## 2023-05-01 DIAGNOSIS — I10 Essential (primary) hypertension: Secondary | ICD-10-CM

## 2023-05-01 NOTE — Patient Instructions (Addendum)
I appreciate the opportunity to provide care to you today!    Follow up:  3 months  PLEASE SCHEDULE MAMMOGRAM   Attached with your AVS, you will find valuable resources for self-education. I highly recommend dedicating some time to thoroughly examine them.   Please continue to a heart-healthy diet and increase your physical activities. Try to exercise for at least five days a week.    It was a pleasure to see you and I look forward to continuing to work together on your health and well-being. Please do not hesitate to call the office if you need care or have questions about your care.  In case of emergency, please visit the Emergency Department for urgent care, or contact our clinic at [phone] to schedule an appointment. We're here to help you!   Have a wonderful day and week. With Gratitude, Gilmore Laroche MSN, FNP-BC

## 2023-05-01 NOTE — Progress Notes (Signed)
Established Patient Office Visit  Subjective:  Patient ID: Mikayla Snow, female    DOB: 01/29/1976  Age: 47 y.o. MRN: [id number]  CC:  Chief Complaint  Patient presents with   Care Management    1 month f/u    HPI Mikayla Snow is a 47 y.o. female with past medical history of hypertension, GERD, Right ear pain presents for BP f/u. For the details of today's visit, please refer to the assessment and plan.     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    Past Surgical History:  Procedure Laterality Date   COLONOSCOPY  02/07/2020   WISDOM TOOTH EXTRACTION      Family History  Problem Relation Age of Onset   Hypertension Mother    Depression Mother    Arthritis Paternal Grandmother        severe in hands   Stroke Paternal Grandfather    Hypertension Father    Prostate cancer Father 36       [place] chemical exposure   Colon polyps Neg Hx    Colon cancer Neg Hx    Esophageal cancer Neg Hx    Stomach cancer Neg Hx    Rectal cancer Neg Hx     Social History   Socioeconomic History   Marital status: Married    Spouse name: Not on file   Number of children: Not on file   Years of education: Not on file   Highest education level: Master's degree (e.g., MA, MS, MEng, MEd, MSW, MBA)  Occupational History   Not on file  Tobacco Use   Smoking status: Never   Smokeless tobacco: Never  Vaping Use   Vaping status: Never Used  Substance and Sexual Activity   Alcohol use: Not Currently    Alcohol/week: 0.0 standard drinks of alcohol    Comment: occ   Drug use: No   Sexual activity:  Yes    Partners: Male    Birth control/protection: Pill  Other Topics Concern   Not on file  Social History Narrative   Not on file   Social Drivers of Health   Financial Resource Strain: Low Risk  (04/27/2023)   Overall Financial Resource Strain (CARDIA)    Difficulty of Paying Living Expenses: Not hard at all  Food Insecurity: No Food Insecurity (04/27/2023)   Hunger Vital Sign    Worried About Running Out of Food in the Last Year: Never true    Ran Out of Food in the Last Year: Never true  Transportation Needs: No Transportation Needs (04/27/2023)   PRAPARE - Administrator, Civil Service (Medical): No    Lack of Transportation (Non-Medical): No  Physical Activity: Insufficiently Active (04/27/2023)   Exercise Vital Sign    Days of Exercise per Week: 4 days    Minutes of Exercise per Session: 30 min  Stress: No Stress Concern Present (04/27/2023)   [employer] of Occupational Health - Occupational Stress Questionnaire    Feeling of Stress :  Only a little  Social Connections: Socially Integrated (04/27/2023)   Social Connection and Isolation Panel [NHANES]    Frequency of Communication with Friends and Family: Twice a week    Frequency of Social Gatherings with Friends and Family: Once a week    Attends Religious Services: More than 4 times per year    Active Member of [employer] or Organizations: Yes    Attends Engineer, structural: More than 4 times per year    Marital Status: Married  Catering manager Violence: Not At Risk (01/27/2023)   Humiliation, Afraid, Rape, and Kick questionnaire    Fear of Current or Ex-Partner: No    Emotionally Abused: No    Physically Abused: No    Sexually Abused: No    Outpatient Medications Prior to Visit  Medication Sig Dispense Refill   CALCIUM PO Take by mouth daily.     cetirizine (ZYRTEC) 10 MG tablet Take 10 mg by mouth daily.     CRYSELLE-28 0.3-30 MG-MCG tablet TAKE 1 TABLET BY MOUTH DAILY 84 tablet 4    ferrous sulfate 325 (65 FE) MG tablet Take 325 mg by mouth daily with breakfast.     fluticasone (FLONASE) 50 MCG/ACT nasal spray Place 2 sprays into both nostrils as needed for allergies or rhinitis.     lisinopril (ZESTRIL) 10 MG tablet Take 1 tablet (10 mg total) by mouth daily. 90 tablet 3   omeprazole (PRILOSEC) 20 MG capsule TAKE ONE CAPSULE BY MOUTH EVERY DAY 30 MINUTES BEFORE MEAL 30 capsule 3   VITAMIN D PO Take by mouth.     No facility-administered medications prior to visit.    No Known Allergies  ROS Review of Systems  Constitutional:  Negative for chills and fever.  Eyes:  Negative for visual disturbance.  Respiratory:  Negative for chest tightness and shortness of breath.   Neurological:  Negative for dizziness and headaches.      Objective:    Physical Exam HENT:     Head: Normocephalic.     Right Ear: Tympanic membrane normal. There is no impacted cerumen.     Left Ear: Tympanic membrane normal. There is no impacted cerumen.     Mouth/Throat:     Mouth: Mucous membranes are moist.  Cardiovascular:     Rate and Rhythm: Normal rate.     Heart sounds: Normal heart sounds.  Pulmonary:     Effort: Pulmonary effort is normal.     Breath sounds: Normal breath sounds.  Neurological:     Mental Status: She is alert.     BP 128/82   Pulse 98   Wt 172 lb 0.6 oz (78 kg)   SpO2 99%   BMI 27.77 kg/m  Wt Readings from Last 3 Encounters:  05/01/23 172 lb 0.6 oz (78 kg)  03/22/23 173 lb 1.3 oz (78.5 kg)  01/27/23 170 lb (77.1 kg)    Lab Results  Component Value Date   TSH 2.820 04/13/2023   Lab Results  Component Value Date   WBC 3.7 04/13/2023   HGB 12.9 04/13/2023   HCT 40.7 04/13/2023   MCV 86 04/13/2023   PLT 236 04/13/2023   Lab Results  Component Value Date   NA 139 04/13/2023   K 4.6 04/13/2023   CO2 23 04/13/2023   GLUCOSE 87 04/13/2023   BUN 9 04/13/2023   CREATININE 0.89 04/13/2023   BILITOT 0.3 04/13/2023   ALKPHOS 42 (L) 04/13/2023    AST 16 04/13/2023  ALT 10 04/13/2023   PROT 6.5 04/13/2023   ALBUMIN 4.2 04/13/2023   CALCIUM 9.2 04/13/2023   ANIONGAP 7 09/04/2020   EGFR 79 04/13/2023   GFR 84.50 09/02/2019   Lab Results  Component Value Date   CHOL 222 (H) 04/13/2023   Lab Results  Component Value Date   HDL 37 (L) 04/13/2023   Lab Results  Component Value Date   LDLCALC 166 (H) 04/13/2023   Lab Results  Component Value Date   TRIG 105 04/13/2023   Lab Results  Component Value Date   CHOLHDL 6.0 (H) 04/13/2023   Lab Results  Component Value Date   HGBA1C 5.4 04/13/2023      Assessment & Plan:  Primary hypertension Assessment & Plan: The patient's blood pressure is controlled in the clinic today. She is currently asymptomatic and reports compliance with lisinopril 10 mg daily. She is encouraged to continue her current treatment regimen. A low-sodium diet and increased physical activity were recommended to further support her cardiovascular health. The patient verbalized understanding and is aware of the plan of care. BP Readings from Last 3 Encounters:  05/01/23 128/82  03/22/23 (!) 142/82  01/27/23 127/77        Right ear pain Assessment & Plan: Intermittent stabbing right ear pain lasting only a few minutes.  This has been ongoing for quite some time now.  Discussed the option of ENT referral with patient however at this time she would like to monitor to see if symptoms worsen as this is not very bothersome.  No other associated symptoms.  Ears clear today. Recommend reaching out if symptoms worsen or become bothersome and we will be happy to send referral for her.   Note: This chart has been completed using Engineer, civil (consulting) software, and while attempts have been made to ensure accuracy, certain words and phrases may not be transcribed as intended.    Follow-up: Return in about 3 months (around 07/30/2023).   Gilmore Laroche, FNP

## 2023-05-02 NOTE — Assessment & Plan Note (Signed)
Intermittent stabbing right ear pain lasting only a few minutes.  This has been ongoing for quite some time now.  Discussed the option of ENT referral with patient however at this time she would like to monitor to see if symptoms worsen as this is not very bothersome.  No other associated symptoms.  Ears clear today. ?Recommend reaching out if symptoms worsen or become bothersome and we will be happy to send referral for her. ?

## 2023-05-02 NOTE — Assessment & Plan Note (Addendum)
 The patient's blood pressure is controlled in the clinic today. She is currently asymptomatic and reports compliance with lisinopril  10 mg daily. She is encouraged to continue her current treatment regimen. A low-sodium diet and increased physical activity were recommended to further support her cardiovascular health. The patient verbalized understanding and is aware of the plan of care. BP Readings from Last 3 Encounters:  05/01/23 128/82  03/22/23 (!) 142/82  01/27/23 127/77

## 2023-05-10 ENCOUNTER — Encounter (HOSPITAL_COMMUNITY): Payer: Self-pay

## 2023-05-10 ENCOUNTER — Ambulatory Visit (HOSPITAL_COMMUNITY)
Admission: RE | Admit: 2023-05-10 | Discharge: 2023-05-10 | Disposition: A | Discharge status: Home / Self Care | Payer: 59 | Source: Ambulatory Visit | Attending: Family Medicine | Admitting: Family Medicine

## 2023-05-10 DIAGNOSIS — Z1231 Encounter for screening mammogram for malignant neoplasm of breast: Secondary | ICD-10-CM | POA: Insufficient documentation

## 2023-09-04 ENCOUNTER — Ambulatory Visit: Payer: BC Managed Care – PPO | Admitting: Family Medicine

## 2023-09-04 ENCOUNTER — Encounter: Payer: Self-pay | Admitting: Family Medicine

## 2023-09-04 VITALS — BP 119/78 | HR 82 | Ht 66.0 in | Wt 167.1 lb

## 2023-09-04 DIAGNOSIS — I10 Essential (primary) hypertension: Secondary | ICD-10-CM | POA: Diagnosis not present

## 2023-09-04 DIAGNOSIS — E038 Other specified hypothyroidism: Secondary | ICD-10-CM

## 2023-09-04 DIAGNOSIS — K219 Gastro-esophageal reflux disease without esophagitis: Secondary | ICD-10-CM | POA: Diagnosis not present

## 2023-09-04 DIAGNOSIS — R7301 Impaired fasting glucose: Secondary | ICD-10-CM | POA: Diagnosis not present

## 2023-09-04 DIAGNOSIS — E559 Vitamin D deficiency, unspecified: Secondary | ICD-10-CM | POA: Diagnosis not present

## 2023-09-04 DIAGNOSIS — E7849 Other hyperlipidemia: Secondary | ICD-10-CM

## 2023-09-04 MED ORDER — LISINOPRIL 10 MG PO TABS: 10.0000 mg | ORAL_TABLET | Freq: Every day | ORAL | 3 refills | Status: DC

## 2023-09-04 NOTE — Progress Notes (Signed)
 Established Patient Office Visit  Subjective:  Patient ID: Mikayla Snow, female    DOB: [birth year]  Age: 48 y.o. MRN: [id number]  CC:  Chief Complaint  Patient presents with   Medical Management of Chronic Issues    4 month f/u     HPI Mikayla Snow is a 48 y.o. female with past medical history of hypertension presents for f/u of  chronic medical conditions. For the details of today's visit, please refer to the assessment and plan.     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    Past Surgical History:  Procedure Laterality Date   COLONOSCOPY  02/07/2020   WISDOM TOOTH EXTRACTION      Family History  Problem Relation Age of Onset   Hypertension Mother    Depression Mother    Arthritis Paternal Grandmother        severe in hands   Stroke Paternal Grandfather    Hypertension Father    Prostate cancer Father 59       [place] chemical exposure   Colon polyps Neg Hx    Colon cancer Neg Hx    Esophageal cancer Neg Hx    Stomach cancer Neg Hx    Rectal cancer Neg Hx     Social History   Socioeconomic History   Marital status: Married    Spouse name: Not on file   Number of children: Not on file   Years of education: Not on file   Highest education level: Master's degree (e.g., MA, MS, MEng, MEd, MSW, MBA)  Occupational History   Not on file  Tobacco Use   Smoking status: Never   Smokeless tobacco: Never  Vaping Use   Vaping status: Never Used  Substance and Sexual Activity   Alcohol use: Not Currently    Alcohol/week: 0.0 standard drinks of alcohol    Comment: occ   Drug  use: No   Sexual activity: Yes    Partners: Male    Birth control/protection: Pill  Other Topics Concern   Not on file  Social History Narrative   Not on file   Social Drivers of Health   Financial Resource Strain: Low Risk  (04/27/2023)   Overall Financial Resource Strain (CARDIA)    Difficulty of Paying Living Expenses: Not hard at all  Food Insecurity: No Food Insecurity (04/27/2023)   Hunger Vital Sign    Worried About Running Out of Food in the Last Year: Never true    Ran Out of Food in the Last Year: Never true  Transportation Needs: No Transportation Needs (04/27/2023)   PRAPARE - Administrator, Civil Service (Medical): No    Lack of Transportation (Non-Medical): No  Physical Activity: Insufficiently Active (04/27/2023)   Exercise Vital Sign    Days of Exercise per Week: 4 days    Minutes of Exercise per Session: 30 min  Stress: No Stress Concern Present (04/27/2023)   [employer] of Occupational Health - Occupational Stress Questionnaire  Feeling of Stress : Only a little  Social Connections: Socially Integrated (04/27/2023)   Social Connection and Isolation Panel [NHANES]    Frequency of Communication with Friends and Family: Twice a week    Frequency of Social Gatherings with Friends and Family: Once a week    Attends Religious Services: More than 4 times per year    Active Member of [employer] or Organizations: Yes    Attends Engineer, structural: More than 4 times per year    Marital Status: Married  Catering manager Violence: Not At Risk (01/27/2023)   Humiliation, Afraid, Rape, and Kick questionnaire    Fear of Current or Ex-Partner: No    Emotionally Abused: No    Physically Abused: No    Sexually Abused: No    Outpatient Medications Prior to Visit  Medication Sig Dispense Refill   CALCIUM PO Take by mouth daily.     cetirizine (ZYRTEC) 10 MG tablet Take 10 mg by mouth daily.     CRYSELLE -28 0.3-30 MG-MCG tablet TAKE 1 TABLET BY  MOUTH DAILY 84 tablet 4   ferrous sulfate 325 (65 FE) MG tablet Take 325 mg by mouth daily with breakfast.     fluticasone (FLONASE) 50 MCG/ACT nasal spray Place 2 sprays into both nostrils as needed for allergies or rhinitis.     omeprazole (PRILOSEC) 20 MG capsule TAKE ONE CAPSULE BY MOUTH EVERY DAY 30 MINUTES BEFORE MEAL 30 capsule 3   VITAMIN D  PO Take by mouth.     lisinopril  (ZESTRIL ) 10 MG tablet Take 1 tablet (10 mg total) by mouth daily. 90 tablet 3   No facility-administered medications prior to visit.    No Known Allergies  ROS Review of Systems  Constitutional:  Negative for chills and fever.  Eyes:  Negative for visual disturbance.  Respiratory:  Negative for chest tightness and shortness of breath.   Neurological:  Negative for dizziness and headaches.      Objective:    Physical Exam HENT:     Head: Normocephalic.     Mouth/Throat:     Mouth: Mucous membranes are moist.  Cardiovascular:     Rate and Rhythm: Normal rate.     Heart sounds: Normal heart sounds.  Pulmonary:     Effort: Pulmonary effort is normal.     Breath sounds: Normal breath sounds.  Neurological:     Mental Status: She is alert.     BP 119/78   Pulse 82   Ht 5\' 6"  (1.676 m)   Wt 167 lb 1.9 oz (75.8 kg)   LMP 08/28/2023 (Exact Date)   SpO2 98%   BMI 26.97 kg/m  Wt Readings from Last 3 Encounters:  09/04/23 167 lb 1.9 oz (75.8 kg)  05/01/23 172 lb 0.6 oz (78 kg)  03/22/23 173 lb 1.3 oz (78.5 kg)    Lab Results  Component Value Date   TSH 2.820 04/13/2023   Lab Results  Component Value Date   WBC 3.7 04/13/2023   HGB 12.9 04/13/2023   HCT 40.7 04/13/2023   MCV 86 04/13/2023   PLT 236 04/13/2023   Lab Results  Component Value Date   NA 139 04/13/2023   K 4.6 04/13/2023   CO2 23 04/13/2023   GLUCOSE 87 04/13/2023   BUN 9 04/13/2023   CREATININE 0.89 04/13/2023   BILITOT 0.3 04/13/2023   ALKPHOS 42 (L) 04/13/2023   AST 16 04/13/2023   ALT 10 04/13/2023   PROT 6.5  04/13/2023  ALBUMIN 4.2 04/13/2023   CALCIUM 9.2 04/13/2023   ANIONGAP 7 09/04/2020   EGFR 79 04/13/2023   GFR 84.50 09/02/2019   Lab Results  Component Value Date   CHOL 222 (H) 04/13/2023   Lab Results  Component Value Date   HDL 37 (L) 04/13/2023   Lab Results  Component Value Date   LDLCALC 166 (H) 04/13/2023   Lab Results  Component Value Date   TRIG 105 04/13/2023   Lab Results  Component Value Date   CHOLHDL 6.0 (H) 04/13/2023   Lab Results  Component Value Date   HGBA1C 5.4 04/13/2023      Assessment & Plan:  Primary hypertension Assessment & Plan: The patient's blood pressure is controlled in the clinic today. She is currently asymptomatic and reports compliance with lisinopril  10 mg daily. She is encouraged to continue her current treatment regimen. A low-sodium diet and increased physical activity were recommended to further support her cardiovascular health. The patient verbalized understanding and is aware of the plan of care. BP Readings from Last 3 Encounters:  09/04/23 119/78  05/01/23 128/82  03/22/23 (!) 142/82       Orders: -     Lisinopril ; Take 1 tablet (10 mg total) by mouth daily.  Dispense: 90 tablet; Refill: 3  Gastroesophageal reflux disease without esophagitis Assessment & Plan: GERD diet encouraged Continue taking omeprazole 20 mg as needed   IFG (impaired fasting glucose) -     Hemoglobin A1c  Vitamin D  deficiency -     VITAMIN D  25 Hydroxy (Vit-D Deficiency, Fractures)  TSH (thyroid-stimulating hormone deficiency) -     TSH + free T4  Other hyperlipidemia -     Lipid panel -     CMP14+EGFR -     CBC with Differential/Platelet  Note: This chart has been completed using Engineer, civil (consulting) software, and while attempts have been made to ensure accuracy, certain words and phrases may not be transcribed as intended.    Follow-up: Return in about 5 months (around 02/04/2024).   Garrett Bowring, FNP

## 2023-09-04 NOTE — Patient Instructions (Addendum)
 I appreciate the opportunity to provide care to you today!    Follow up:  5 months  Labs: please stop by the lab during the week to get your blood drawn (CBC, CMP, TSH, Lipid profile, HgA1c, Vit D)  For managing GERD, I recommend the following lifestyle changes:  Avoid Certain Foods and Drinks: Limit or eliminate coffee, chocolate, onions, peppermint, spicy foods, carbonated beverages, citrus fruits, tomatoes, garlic, alcohol, and fatty foods such as bacon, burgers, sausages, steak, fried foods, and dairy products.  Recommended Foods: Increase your intake of high-fiber foods including whole grain cereals, oatmeal, brown rice, root vegetables, and non-citrus fruits. Opt for high-protein foods and healthy fats such as avocados, olive oil, nuts, and seeds.    Attached with your AVS, you will find valuable resources for self-education. I highly recommend dedicating some time to thoroughly examine them.   Please continue to a heart-healthy diet and increase your physical activities. Try to exercise for at least five days a week.    It was a pleasure to see you and I look forward to continuing to work together on your health and well-being. Please do not hesitate to call the office if you need care or have questions about your care.  In case of emergency, please visit the Emergency Department for urgent care, or contact our clinic at [phone] to schedule an appointment. We're here to help you!   Have a wonderful day and week. With Gratitude, Weaver Tweed MSN, FNP-BC

## 2023-09-04 NOTE — Assessment & Plan Note (Signed)
 The patient's blood pressure is controlled in the clinic today. She is currently asymptomatic and reports compliance with lisinopril  10 mg daily. She is encouraged to continue her current treatment regimen. A low-sodium diet and increased physical activity were recommended to further support her cardiovascular health. The patient verbalized understanding and is aware of the plan of care. BP Readings from Last 3 Encounters:  09/04/23 119/78  05/01/23 128/82  03/22/23 (!) 142/82

## 2023-09-04 NOTE — Assessment & Plan Note (Signed)
 GERD diet encouraged Continue taking omeprazole 20 mg as needed

## 2024-02-08 ENCOUNTER — Encounter: Payer: Self-pay | Admitting: Family Medicine

## 2024-02-08 ENCOUNTER — Ambulatory Visit: Admitting: Family Medicine

## 2024-02-08 VITALS — BP 135/78 | HR 60 | Resp 18 | Ht 66.0 in | Wt 175.1 lb

## 2024-02-08 DIAGNOSIS — K219 Gastro-esophageal reflux disease without esophagitis: Secondary | ICD-10-CM

## 2024-02-08 DIAGNOSIS — I10 Essential (primary) hypertension: Secondary | ICD-10-CM | POA: Diagnosis not present

## 2024-02-08 DIAGNOSIS — Z23 Encounter for immunization: Secondary | ICD-10-CM

## 2024-02-08 NOTE — Patient Instructions (Signed)
 I appreciate the opportunity to provide care to you today!    Follow up:  6 months  Fasting Labs: please stop by the labs during the week to get your blood drawn (CBC, CMP, TSH, Lipid profile, HgA1c, Vit D)  For a Healthier YOU, I Recommend: Reducing your intake of sugar, sodium, carbohydrates, and saturated fats. Increasing your fiber intake by incorporating more whole grains, fruits, and vegetables into your meals. Setting healthy goals with a focus on lowering your consumption of carbs, sugar, and unhealthy fats. Adding variety to your diet by including a wide range of fruits and vegetables. Cutting back on soda and limiting processed foods as much as possible. Staying active: In addition to taking your weight loss medication, aim for at least 150 minutes of moderate-intensity physical activity each week for optimal results.    Please follow up if your symptoms worsen or fail to improve.    Please continue to a heart-healthy diet and increase your physical activities. Try to exercise for at least five days a week.    It was a pleasure to see you and I look forward to continuing to work together on your health and well-being. Please do not hesitate to call the office if you need care or have questions about your care.  In case of emergency, please visit the Emergency Department for urgent care, or contact our clinic at [phone] to schedule an appointment. We're here to help you!   Have a wonderful day and week. With Gratitude, Meade JENEANE Gerlach MSN, FNP-BC, PMHNP-BC

## 2024-02-08 NOTE — Assessment & Plan Note (Signed)
 GERD diet encouraged Continue taking omeprazole 20 mg as needed

## 2024-02-08 NOTE — Progress Notes (Signed)
 Established Patient Office Visit  Subjective:  Patient ID: Mikayla Snow, female    DOB: 10-Jan-1976  Age: 48 y.o. MRN: [id number]  CC:  Chief Complaint  Patient presents with   Hypertension    5 month follow up     HPI Mikayla Snow is a 48 y.o. female with past medical history of Primary hypertension, GERD presents for f/u of  chronic medical conditions.  For the details of today's visit, please refer to the assessment and plan.     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    Past Surgical History:  Procedure Laterality Date   COLONOSCOPY  02/07/2020   WISDOM TOOTH EXTRACTION      Family History  Problem Relation Age of Onset   Hypertension Mother    Depression Mother    Arthritis Paternal Grandmother        severe in hands   Stroke Paternal Grandfather    Hypertension Father    Prostate cancer Father 73       [place] chemical exposure   Cancer Father    Colon polyps Neg Hx    Colon cancer Neg Hx    Esophageal cancer Neg Hx    Stomach cancer Neg Hx    Rectal cancer Neg Hx     Social History   Socioeconomic History   Marital status: Married    Spouse name: Not on file   Number of children: Not on file   Years of education: Not on file   Highest education level: Master's degree (e.g., MA, MS, MEng, MEd, MSW, MBA)  Occupational History   Not on file  Tobacco Use   Smoking status: Never   Smokeless tobacco: Never  Vaping Use   Vaping status: Never Used  Substance and Sexual Activity   Alcohol use: Not Currently    Alcohol/week: 0.0 standard drinks of alcohol    Comment:  occ   Drug use: No   Sexual activity: Yes    Partners: Male    Birth control/protection: Pill  Other Topics Concern   Not on file  Social History Narrative   Not on file   Social Drivers of Health   Financial Resource Strain: Low Risk  (02/07/2024)   Overall Financial Resource Strain (CARDIA)    Difficulty of Paying Living Expenses: Not hard at all  Food Insecurity: No Food Insecurity (02/07/2024)   Hunger Vital Sign    Worried About Running Out of Food in the Last Year: Never true    Ran Out of Food in the Last Year: Never true  Transportation Needs: No Transportation Needs (02/07/2024)   PRAPARE - Administrator, Civil Service (Medical): No    Lack of Transportation (Non-Medical): No  Physical Activity: Insufficiently Active (02/07/2024)   Exercise Vital Sign    Days of Exercise per Week: 2 days    Minutes of Exercise per Session: 30 min  Stress: No Stress Concern Present (02/07/2024)   [employer] of Occupational Health - Occupational  Stress Questionnaire    Feeling of Stress: Only a little  Social Connections: Moderately Integrated (02/07/2024)   Social Connection and Isolation Panel    Frequency of Communication with Friends and Family: Once a week    Frequency of Social Gatherings with Friends and Family: Once a week    Attends Religious Services: More than 4 times per year    Active Member of [employer] or Organizations: Yes    Attends Engineer, structural: More than 4 times per year    Marital Status: Married  Catering manager Violence: Not At Risk (01/27/2023)   Humiliation, Afraid, Rape, and Kick questionnaire    Fear of Current or Ex-Partner: No    Emotionally Abused: No    Physically Abused: No    Sexually Abused: No    Outpatient Medications Prior to Visit  Medication Sig Dispense Refill   CALCIUM PO Take by mouth daily.     cetirizine (ZYRTEC) 10 MG tablet Take 10 mg by mouth daily.     CRYSELLE -28 0.3-30 MG-MCG tablet TAKE 1 TABLET BY  MOUTH DAILY 84 tablet 4   ferrous sulfate 325 (65 FE) MG tablet Take 325 mg by mouth daily with breakfast.     fluticasone (FLONASE) 50 MCG/ACT nasal spray Place 2 sprays into both nostrils as needed for allergies or rhinitis.     lisinopril  (ZESTRIL ) 10 MG tablet Take 1 tablet (10 mg total) by mouth daily. 90 tablet 3   omeprazole (PRILOSEC) 20 MG capsule TAKE ONE CAPSULE BY MOUTH EVERY DAY 30 MINUTES BEFORE MEAL 30 capsule 3   VITAMIN D  PO Take by mouth.     No facility-administered medications prior to visit.    No Known Allergies  ROS Review of Systems  Constitutional:  Negative for chills and fever.  Eyes:  Negative for visual disturbance.  Respiratory:  Negative for chest tightness and shortness of breath.   Neurological:  Negative for dizziness and headaches.      Objective:    Physical Exam HENT:     Head: Normocephalic.     Mouth/Throat:     Mouth: Mucous membranes are moist.  Cardiovascular:     Rate and Rhythm: Normal rate.     Heart sounds: Normal heart sounds.  Pulmonary:     Effort: Pulmonary effort is normal.     Breath sounds: Normal breath sounds.  Neurological:     Mental Status: She is alert.     BP 135/78   Pulse 60   Resp 18   Ht 5' 6 (1.676 m)   Wt 175 lb 1.9 oz (79.4 kg)   SpO2 97%   BMI 28.27 kg/m  Wt Readings from Last 3 Encounters:  02/08/24 175 lb 1.9 oz (79.4 kg)  09/04/23 167 lb 1.9 oz (75.8 kg)  05/01/23 172 lb 0.6 oz (78 kg)    Lab Results  Component Value Date   TSH 2.820 04/13/2023   Lab Results  Component Value Date   WBC 3.7 04/13/2023   HGB 12.9 04/13/2023   HCT 40.7 04/13/2023   MCV 86 04/13/2023   PLT 236 04/13/2023   Lab Results  Component Value Date   NA 139 04/13/2023   K 4.6 04/13/2023   CO2 23 04/13/2023   GLUCOSE 87 04/13/2023   BUN 9 04/13/2023   CREATININE 0.89 04/13/2023   BILITOT 0.3 04/13/2023   ALKPHOS 42 (L) 04/13/2023   AST 16 04/13/2023   ALT 10 04/13/2023   PROT 6.5 04/13/2023  ALBUMIN 4.2 04/13/2023   CALCIUM 9.2 04/13/2023   ANIONGAP 7 09/04/2020   EGFR 79 04/13/2023   GFR 84.50 09/02/2019   Lab Results  Component Value Date   CHOL 222 (H) 04/13/2023   Lab Results  Component Value Date   HDL 37 (L) 04/13/2023   Lab Results  Component Value Date   LDLCALC 166 (H) 04/13/2023   Lab Results  Component Value Date   TRIG 105 04/13/2023   Lab Results  Component Value Date   CHOLHDL 6.0 (H) 04/13/2023   Lab Results  Component Value Date   HGBA1C 5.4 04/13/2023      Assessment & Plan:  Primary hypertension Assessment & Plan: The patient's blood pressure is controlled in the clinic today. She is currently asymptomatic and reports compliance with lisinopril  10 mg daily. She is encouraged to continue her current treatment regimen. A low-sodium diet and increased physical activity were recommended to further support her cardiovascular health. The patient verbalized understanding and is aware of the plan of care. BP Readings from Last 3 Encounters:  02/08/24 135/78  09/04/23 119/78  05/01/23 128/82        Gastroesophageal reflux disease without esophagitis Assessment & Plan: GERD diet encouraged Continue taking omeprazole 20 mg as needed   Encounter for immunization Assessment & Plan: Patient educated on CDC recommendation for the vaccine. Verbal consent was obtained from the patient, vaccine administered by nurse, no sign of adverse reactions noted at this time. Patient education on arm soreness and use of tylenol  or ibuprofen for this patient  was discussed. Patient educated on the signs and symptoms of adverse effect and advise to contact the office if they occur.   Orders: -     Flu vaccine trivalent PF, 6mos and older(Flulaval,Afluria,Fluarix,Fluzone)   Note: This chart has been completed using Engineer, civil (consulting) software, and while attempts have been made to ensure accuracy, certain words and phrases may not be transcribed as  intended.   Follow-up: Return in about 6 months (around 08/08/2024).   Ronit Marczak  Z Bacchus, FNP

## 2024-02-08 NOTE — Assessment & Plan Note (Signed)
 Patient educated on CDC recommendation for the vaccine. Verbal consent was obtained from the patient, vaccine administered by nurse, no sign of adverse reactions noted at this time. Patient education on arm soreness and use of tylenol or ibuprofen for this patient  was discussed. Patient educated on the signs and symptoms of adverse effect and advise to contact the office if they occur.

## 2024-02-08 NOTE — Assessment & Plan Note (Signed)
 The patient's blood pressure is controlled in the clinic today. She is currently asymptomatic and reports compliance with lisinopril  10 mg daily. She is encouraged to continue her current treatment regimen. A low-sodium diet and increased physical activity were recommended to further support her cardiovascular health. The patient verbalized understanding and is aware of the plan of care. BP Readings from Last 3 Encounters:  02/08/24 135/78  09/04/23 119/78  05/01/23 128/82

## 2024-02-16 ENCOUNTER — Ambulatory Visit: Payer: Self-pay | Admitting: Family Medicine

## 2024-02-16 ENCOUNTER — Ambulatory Visit: Admitting: Adult Health

## 2024-02-16 ENCOUNTER — Encounter: Payer: Self-pay | Admitting: Adult Health

## 2024-02-16 VITALS — BP 139/87 | HR 64 | Ht 66.0 in | Wt 174.0 lb

## 2024-02-16 DIAGNOSIS — Z3041 Encounter for surveillance of contraceptive pills: Secondary | ICD-10-CM

## 2024-02-16 DIAGNOSIS — Z01419 Encounter for gynecological examination (general) (routine) without abnormal findings: Secondary | ICD-10-CM | POA: Diagnosis not present

## 2024-02-16 DIAGNOSIS — Z1331 Encounter for screening for depression: Secondary | ICD-10-CM | POA: Diagnosis not present

## 2024-02-16 LAB — CMP14+EGFR
ALT: 8 IU/L (ref 0–32)
AST: 13 IU/L (ref 0–40)
Albumin: 4.4 g/dL (ref 3.9–4.9)
Alkaline Phosphatase: 48 IU/L (ref 41–116)
BUN/Creatinine Ratio: 10 (ref 9–23)
BUN: 9 mg/dL (ref 6–24)
Bilirubin Total: 0.4 mg/dL (ref 0.0–1.2)
CO2: 21 mmol/L (ref 20–29)
Calcium: 9.1 mg/dL (ref 8.7–10.2)
Chloride: 103 mmol/L (ref 96–106)
Creatinine, Ser: 0.86 mg/dL (ref 0.57–1.00)
Globulin, Total: 2.4 g/dL (ref 1.5–4.5)
Glucose: 89 mg/dL (ref 70–99)
Potassium: 4.2 mmol/L (ref 3.5–5.2)
Sodium: 138 mmol/L (ref 134–144)
Total Protein: 6.8 g/dL (ref 6.0–8.5)
eGFR: 82 mL/min/1.73 (ref >59)

## 2024-02-16 LAB — LIPID PANEL
Chol/HDL Ratio: 5.4 ratio — ABNORMAL HIGH (ref 0.0–4.4)
Cholesterol, Total: 212 mg/dL — ABNORMAL HIGH (ref 100–199)
HDL: 39 mg/dL — ABNORMAL LOW (ref >39)
LDL Chol Calc (NIH): 154 mg/dL — ABNORMAL HIGH (ref 0–99)
Triglycerides: 105 mg/dL (ref 0–149)
VLDL Cholesterol Cal: 19 mg/dL (ref 5–40)

## 2024-02-16 LAB — CBC WITH DIFFERENTIAL/PLATELET
Basophils Absolute: 0 x10E3/uL (ref 0.0–0.2)
Basos: 1 %
EOS (ABSOLUTE): 0.1 x10E3/uL (ref 0.0–0.4)
Eos: 3 %
Hematocrit: 42.6 % (ref 34.0–46.6)
Hemoglobin: 13.9 g/dL (ref 11.1–15.9)
Immature Grans (Abs): 0 x10E3/uL (ref 0.0–0.1)
Immature Granulocytes: 0 %
Lymphocytes Absolute: 1 x10E3/uL (ref 0.7–3.1)
Lymphs: 23 %
MCH: 30.2 pg (ref 26.6–33.0)
MCHC: 32.6 g/dL (ref 31.5–35.7)
MCV: 92 fL (ref 79–97)
Monocytes Absolute: 0.3 x10E3/uL (ref 0.1–0.9)
Monocytes: 6 %
Neutrophils Absolute: 2.9 x10E3/uL (ref 1.4–7.0)
Neutrophils: 66 %
Platelets: 205 x10E3/uL (ref 150–450)
RBC: 4.61 x10E6/uL (ref 3.77–5.28)
RDW: 12.7 % (ref 11.7–15.4)
WBC: 4.4 x10E3/uL (ref 3.4–10.8)

## 2024-02-16 LAB — VITAMIN D 25 HYDROXY (VIT D DEFICIENCY, FRACTURES): Vit D, 25-Hydroxy: 38.4 ng/mL (ref 30.0–100.0)

## 2024-02-16 LAB — TSH+FREE T4
Free T4: 0.99 ng/dL (ref 0.82–1.77)
TSH: 2.52 u[IU]/mL (ref 0.450–4.500)

## 2024-02-16 LAB — HEMOGLOBIN A1C
Est. average glucose Bld gHb Est-mCnc: 97 mg/dL
Hgb A1c MFr Bld: 5 % (ref 4.8–5.6)

## 2024-02-16 MED ORDER — CRYSELLE-28 0.3-30 MG-MCG PO TABS: 1.0000 | ORAL_TABLET | Freq: Every day | ORAL | 4 refills | Status: AC

## 2024-02-16 NOTE — Progress Notes (Signed)
 Patient ID: Mikayla Snow, female   DOB: 03/07/1976, 48 y.o.   MRN: [id number] History of Present Illness: Mikayla Snow is a 23 year year old white female, married, G2P2002, in for well woman gyn exam and refill BCP. She teaches at [employer].     Component Value Date/Time   DIAGPAP  01/27/2023 0834    - Negative for intraepithelial lesion or malignancy (NILM)   DIAGPAP  07/08/2019 1543    - Negative for intraepithelial lesion or malignancy (NILM)   DIAGPAP  02/02/2017 0000    NEGATIVE FOR INTRAEPITHELIAL LESIONS OR MALIGNANCY.   HPVHIGH Negative 01/27/2023 0834   HPVHIGH Negative 07/08/2019 1543   ADEQPAP  01/27/2023 0834    Satisfactory for evaluation; transformation zone component PRESENT.   ADEQPAP  07/08/2019 1543    Satisfactory for evaluation; transformation zone component ABSENT.   ADEQPAP  02/02/2017 0000    Satisfactory for evaluation  endocervical/transformation zone component PRESENT.    PCP is Meade Gerlach NP  Current Medications, Allergies, Past Medical History, Past Surgical History, Family History and Social History were reviewed in [employer] record.     Review of Systems: Patient denies any headaches, hearing loss, fatigue, blurred vision, shortness of breath, chest pain, abdominal pain, problems with bowel movements, urination, or intercourse. No joint pain or mood swings.  Have occasional night sweat and hot flash Periods are regular with BCP   Physical Exam:BP 139/87 (BP Location: Right Arm, Patient Position: Sitting, Cuff Size: Normal)   Pulse 64   Ht 5' 6 (1.676 m)   Wt 174 lb (78.9 kg)   LMP 01/29/2024 (Approximate)   BMI 28.08 kg/m   General:  Well developed, well nourished, no acute distress Skin:  Warm and dry Neck:  Midline trachea, normal thyroid, good ROM, no lymphadenopathy Lungs; Clear to auscultation bilaterally Breast:  No dominant palpable mass, retraction, or nipple discharge Cardiovascular: Regular rate and  rhythm Abdomen:  Soft, non tender, no hepatosplenomegaly Pelvic:  External genitalia is normal in appearance, no lesions.  The vagina is normal in appearance. Urethra has no lesions or masses. The cervix is bulbous.  Uterus is felt to be normal size, shape, and contour.  No adnexal masses or tenderness noted.Bladder is non tender, no masses felt. Rectal: Deferred Extremities/musculoskeletal:  No swelling or varicosities noted, no clubbing or cyanosis Psych:  No mood changes, alert and cooperative,seems happy AA is 1 Fall risk is low    02/16/2024   10:25 AM 02/08/2024    4:19 PM 09/04/2023    4:22 PM  Depression screen PHQ 2/9  Decreased Interest 0 0 0  Down, Depressed, Hopeless 0 0 0  PHQ - 2 Score 0 0 0  Altered sleeping 0  1  Tired, decreased energy 1  1  Change in appetite 0  0  Feeling bad or failure about yourself  0  0  Trouble concentrating 0  1  Moving slowly or fidgety/restless 0  0  Suicidal thoughts 0  0  PHQ-9 Score 1  3  Difficult doing work/chores   Not difficult at all       02/16/2024   10:25 AM 02/08/2024    4:19 PM 09/04/2023    4:23 PM 05/01/2023    3:52 PM  GAD 7 : Generalized Anxiety Score  Nervous, Anxious, on Edge 0 0 0 0  Control/stop worrying 0 0 0 0  Worry too much - different things 0 0  0  Trouble relaxing 0 0  0  Restless 0 0  0  Easily annoyed or irritable 0 0  0  Afraid - awful might happen 0 0  0  Total GAD 7 Score 0 0  0  Anxiety Difficulty  Not difficult at all Not difficult at all Not difficult at all    Upstream - 02/16/24 1031       Pregnancy Intention Screening   Does the patient want to become pregnant in the next year? No    Does the patient's partner want to become pregnant in the next year? No    Would the patient like to discuss contraceptive options today? No      Contraception Wrap Up   Current Method Oral Contraceptive    End Method Oral Contraceptive    Contraception Counseling Provided Yes           Examination  chaperoned by Clarita Salt LPN  Impression and plan: 1. Encounter for well woman exam with routine gynecological exam (Primary) Pap in 2027 Physical in 1 year Labs with PCP Mammogram was negative 05/10/23 Colonoscopy per GI stay active   2. Encounter for surveillance of contraceptive pills Happy with cryselle , will refill  - norgestrel -ethinyl estradiol  (CRYSELLE -28) 0.3-30 MG-MCG tablet; Take 1 tablet by mouth daily.  Dispense: 84 tablet; Refill: 4

## 2024-04-19 ENCOUNTER — Other Ambulatory Visit: Payer: Self-pay | Admitting: Family Medicine

## 2024-04-19 DIAGNOSIS — I10 Essential (primary) hypertension: Secondary | ICD-10-CM

## 2024-08-08 ENCOUNTER — Ambulatory Visit: Admitting: Family Medicine
